# Patient Record
Sex: Female | Born: 1995 | Race: White | Hispanic: No | Marital: Single | Smoking: Former smoker
Health system: Southern US, Community
[De-identification: ages and names within clinical notes are randomized; demographics above are authoritative.]

## PROBLEM LIST (undated history)

## (undated) HISTORY — PX: NO PAST SURGERIES: SHX2092

---
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
## 2016-05-21 ENCOUNTER — Ambulatory Visit (INDEPENDENT_AMBULATORY_CARE_PROVIDER_SITE_OTHER): Payer: Managed Care, Other (non HMO) | Admitting: Family Medicine

## 2016-05-21 ENCOUNTER — Encounter: Payer: Self-pay | Admitting: Family Medicine

## 2016-05-21 VITALS — BP 119/67 | HR 87 | Ht 62.5 in | Wt 131.0 lb

## 2016-05-21 DIAGNOSIS — F329 Major depressive disorder, single episode, unspecified: Secondary | ICD-10-CM

## 2016-05-21 DIAGNOSIS — F411 Generalized anxiety disorder: Secondary | ICD-10-CM

## 2016-05-21 DIAGNOSIS — F32A Depression, unspecified: Secondary | ICD-10-CM

## 2016-05-21 MED ORDER — SERTRALINE HCL 50 MG PO TABS: ORAL_TABLET | ORAL | 1 refills | Status: DC

## 2016-05-21 NOTE — Progress Notes (Addendum)
Subjective:    CC: anxiety  HPI:  About 2 weeks ago felt like she was breathing rapidly and was crying.  Says occ gets "episdoes" like this.   She says it's been almost daily for the last 2 weeks that she's had almost 2 episodes per day. She's has a lot of stress going on at work right now and says that she's worried she might actually be fired. She reports that she does feel safe at work. No one is threatening her harming her. She feels like she is sleeping excessively. She's never been on the medication. She is currently living with her mother.*She did discuss some of her problems with her mom and she said no she didn't really want to burden her mom who also suffers from depression.  She does complain of feeling down and depressed nearly every day and has had thoughts of being better off dead almost every day. She has difficulty with concentration. Complains of feeling nervous and anxious nearly every day and trouble relaxing.  Past medical history, Surgical history, Family history not pertinant except as noted below, Social history, Allergies, and medications have been entered into the medical record, reviewed, and corrections made.   Review of Systems: No fevers, chills, night sweats, weight loss, chest pain, or shortness of breath.   Objective:    General: Well Developed, well nourished, and in no acute distress.  Neuro: Alert and oriented x3, extra-ocular muscles intact, sensation grossly intact.  HEENT: Normocephalic, atraumatic  Skin: Warm and dry, no rashes. Cardiac: Regular rate and rhythm, no murmurs rubs or gallops, no lower extremity edema.  Respiratory: Clear to auscultation bilaterally. Not using accessory muscles, speaking in full sentences.   Impression and Recommendations:   Anxiety/Depression - PHQ 9 score of 25 and GAD 7 score of 18. She rates her symptoms as very difficult. This is consistent with severe depression and severe anxiety. Discussed regular exercise,  therapy/counseling, and medication. She's not quite ready to commit to therapy and counseling but strongly encouraged her to consider it. We will start Zoloft. Discussed potential side effects. Follow-up in 2 weeks. She feels like she is not actively suicidal and feels safe at home and work.

## 2016-06-05 ENCOUNTER — Encounter: Payer: Self-pay | Admitting: Family Medicine

## 2016-06-05 ENCOUNTER — Ambulatory Visit (INDEPENDENT_AMBULATORY_CARE_PROVIDER_SITE_OTHER): Payer: Managed Care, Other (non HMO) | Admitting: Family Medicine

## 2016-06-05 VITALS — BP 128/78 | HR 109 | Ht 62.5 in | Wt 130.0 lb

## 2016-06-05 DIAGNOSIS — F329 Major depressive disorder, single episode, unspecified: Secondary | ICD-10-CM

## 2016-06-05 DIAGNOSIS — F32A Depression, unspecified: Secondary | ICD-10-CM

## 2016-06-05 DIAGNOSIS — F411 Generalized anxiety disorder: Secondary | ICD-10-CM | POA: Diagnosis not present

## 2016-06-05 MED ORDER — VENLAFAXINE HCL ER 37.5 MG PO CP24: 37.5000 mg | ORAL_CAPSULE | Freq: Every day | ORAL | 0 refills | Status: DC

## 2016-06-05 NOTE — Patient Instructions (Signed)
Decrease sertraline to half a tab daily on Saturday and Sunday. Then stop the medication.   Can start the Effexor on Monday.   Either take in the morning or at night whichever you prefer.

## 2016-06-05 NOTE — Progress Notes (Signed)
Subjective:    CC:   HPI:  F/U Anxiety and depression- She was last seen about 2 weeks ago.  PHQ 9 previously was 25 and got 7 was 18. We started her on Zoloft. She was not having any active suicidal thoughts and had her follow-up today. Helping with her anxiety but not her depression. Says it is making her sleepy and causing dec appetite.  Lost about 1 lb in 2 weeks.    Past medical history, Surgical history, Family history not pertinant except as noted below, Social history, Allergies, and medications have been entered into the medical record, reviewed, and corrections made.   Review of Systems: No fevers, chills, night sweats, weight loss, chest pain, or shortness of breath.   Objective:    General: Well Developed, well nourished, and in no acute distress.  Neuro: Alert and oriented x3, extra-ocular muscles intact, sensation grossly intact.  HEENT: Normocephalic, atraumatic  Skin: Warm and dry, no rashes. Cardiac: Regular rate and rhythm, no murmurs rubs or gallops, no lower extremity edema.  Respiratory: Clear to auscultation bilaterally. Not using accessory muscles, speaking in full sentences.  Assessment and plan Anxiety/depression-PHQ 9 score of 28 and dad 7 score of 13. She has had a little bit of improvement. We discussed options including moving the sertraline to that time and possibly increasing her dose versus actually switching medications. She is very concerned about the decreased appetite and weight loss and would like to try switching medications. Will change to Effexor. Follow-up in 3 weeks.

## 2016-06-26 ENCOUNTER — Ambulatory Visit (INDEPENDENT_AMBULATORY_CARE_PROVIDER_SITE_OTHER): Payer: Managed Care, Other (non HMO) | Admitting: Family Medicine

## 2016-06-26 ENCOUNTER — Encounter: Payer: Self-pay | Admitting: Family Medicine

## 2016-06-26 VITALS — BP 108/77 | HR 81 | Ht 62.5 in | Wt 129.0 lb

## 2016-06-26 DIAGNOSIS — F411 Generalized anxiety disorder: Secondary | ICD-10-CM | POA: Diagnosis not present

## 2016-06-26 DIAGNOSIS — F329 Major depressive disorder, single episode, unspecified: Secondary | ICD-10-CM

## 2016-06-26 DIAGNOSIS — F32A Depression, unspecified: Secondary | ICD-10-CM

## 2016-06-26 MED ORDER — VENLAFAXINE HCL ER 150 MG PO CP24: 150.0000 mg | ORAL_CAPSULE | Freq: Every day | ORAL | 1 refills | Status: DC

## 2016-06-26 NOTE — Progress Notes (Signed)
Subjective:    CC: GAD  HPI:  GAD - Patient comes in today to follow-up on mood. We decided to start her on venlafaxine. She is up to 75 mg daily and tolerating it well. She says she really hasn't had any side effects at all. She still complains of feeling nervous and on edge more than half the days and also feeling down and depressed more than half the days. She is having some difficulty with sleep but she does work third shift. She doesn't usually get him to about 1:30 and then doesn't go to sleep until about 4:30 or 5 AM. She is feels like she needs a little time to 1 down before she goes to bed. She still has had thoughts of feeling better off dead but no thoughts of actually harming herself. She is happy with the medication thus far. She denies feeling excessively tearful or anxious.  Past medical history, Surgical history, Family history not pertinant except as noted below, Social history, Allergies, and medications have been entered into the medical record, reviewed, and corrections made.   Review of Systems: No fevers, chills, night sweats, weight loss, chest pain, or shortness of breath.   Objective:    General: Well Developed, well nourished, and in no acute distress.  Neuro: Alert and oriented x3, extra-ocular muscles intact, sensation grossly intact.  HEENT: Normocephalic, atraumatic  Skin: Warm and dry, no rashes. Cardiac: Regular rate and rhythm, no murmurs rubs or gallops, no lower extremity edema.  Respiratory: Clear to auscultation bilaterally. Not using accessory muscles, speaking in full sentences.   Impression and Recommendations:    Anxiety/depression-Ganz 7 score of 11 today which is down from previous of 13. PHQ 9 score of 16 today which is down from her previous of 20. She's tolerating it well and to go ahead and push her dose 250 mg. Follow-up in 6 weeks.  Time spent 20 minutes, greater than 50% time counseling about depression and anxiety.

## 2016-06-27 ENCOUNTER — Other Ambulatory Visit: Payer: Self-pay | Admitting: Family Medicine

## 2016-08-03 ENCOUNTER — Encounter: Payer: Self-pay | Admitting: Family Medicine

## 2016-08-03 ENCOUNTER — Ambulatory Visit (INDEPENDENT_AMBULATORY_CARE_PROVIDER_SITE_OTHER): Payer: Managed Care, Other (non HMO) | Admitting: Family Medicine

## 2016-08-03 VITALS — BP 124/69 | HR 107 | Ht 63.0 in | Wt 133.0 lb

## 2016-08-03 DIAGNOSIS — F331 Major depressive disorder, recurrent, moderate: Secondary | ICD-10-CM | POA: Insufficient documentation

## 2016-08-03 DIAGNOSIS — F411 Generalized anxiety disorder: Secondary | ICD-10-CM | POA: Diagnosis not present

## 2016-08-03 DIAGNOSIS — F321 Major depressive disorder, single episode, moderate: Secondary | ICD-10-CM

## 2016-08-03 MED ORDER — ARIPIPRAZOLE 5 MG PO TABS: 5.0000 mg | ORAL_TABLET | Freq: Every day | ORAL | 1 refills | Status: DC

## 2016-08-03 NOTE — Progress Notes (Addendum)
Subjective:    CC: Anxiety/Depression  HPI:  Anxiety/Depression - We started her on venlafaxine and in August. Says she's been on it for at least 2 months now. She was tolerating it well when I last saw her. She does complain of feeling down more than half the days and feeling fatigued. She also reports having thoughts of being better off dead.  We have actually increase her dose to 150 mg when I saw her 4 weeks ago. She is still sleeping excessively. She quit her job which as stressing her out.  She quit last Tuesday. Also her stepfather was "kicked out" ans she has been upset about that. She misses him.  Her mom also has some chronic health problems that worry her as well.  No family hx of bipolar D/O. Mother has Depression  Past medical history, Surgical history, Family history not pertinant except as noted below, Social history, Allergies, and medications have been entered into the medical record, reviewed, and corrections made.   Review of Systems: No fevers, chills, night sweats, weight loss, chest pain, or shortness of breath.   Objective:    General: Well Developed, well nourished, and in no acute distress.  Neuro: Alert and oriented x3, extra-ocular muscles intact, sensation grossly intact.  HEENT: Normocephalic, atraumatic  Skin: Warm and dry, no rashes. Cardiac: Regular rate and rhythm, no murmurs rubs or gallops, no lower extremity edema.  Respiratory: Clear to auscultation bilaterally. Not using accessory muscles, speaking in full sentences.   Impression and Recommendations:    Anxiety/Depression - Her symptoms scores have actually worsened. Dad 7 score of 13 today which is up from previous of 11. And PHQ 9 score of 20, previous was 16.  She artery previously tried Zoloft and did not get a great response. She does feel like the Effexor over all works well but lately she says had several life situations, and feels like during those times it's not working well at all. I really  think she would benefit from some therapy to help with coping skills and help with anger management which she admits she really struggles with. She denies being actively suicidal and feels that she is safe to go home. We discussed the option of changing the medication to something else versus maybe even adding a mood stabilizer. Have her complete a mood questionnaire. She answered yes to 12 of the questions and she rated her symptoms as a moderate problem. His is a positive screening tool. Will start with Abilify 5 mg and see her back in a month and adjust his at that time. She does report that she is safe to go home and is not actively suicidal. She declined therapy/counseling.  Time spent 25 min, > 50% spent counseling about anxiety/depression

## 2016-08-31 ENCOUNTER — Ambulatory Visit (INDEPENDENT_AMBULATORY_CARE_PROVIDER_SITE_OTHER): Payer: Managed Care, Other (non HMO) | Admitting: Family Medicine

## 2016-08-31 ENCOUNTER — Encounter: Payer: Self-pay | Admitting: Family Medicine

## 2016-08-31 VITALS — BP 105/81 | HR 100 | Ht 63.0 in | Wt 129.0 lb

## 2016-08-31 DIAGNOSIS — F39 Unspecified mood [affective] disorder: Secondary | ICD-10-CM

## 2016-08-31 DIAGNOSIS — F411 Generalized anxiety disorder: Secondary | ICD-10-CM

## 2016-08-31 DIAGNOSIS — Z91419 Personal history of unspecified adult abuse: Secondary | ICD-10-CM | POA: Diagnosis not present

## 2016-08-31 DIAGNOSIS — F321 Major depressive disorder, single episode, moderate: Secondary | ICD-10-CM

## 2016-08-31 MED ORDER — VENLAFAXINE HCL ER 150 MG PO CP24: 150.0000 mg | ORAL_CAPSULE | Freq: Every day | ORAL | 2 refills | Status: DC
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
## 2016-09-22 ENCOUNTER — Other Ambulatory Visit: Payer: Self-pay

## 2016-09-22 MED ORDER — VENLAFAXINE HCL ER 150 MG PO CP24: 150.0000 mg | ORAL_CAPSULE | Freq: Every day | ORAL | 0 refills | Status: DC

## 2016-11-02 ENCOUNTER — Encounter: Payer: Self-pay | Admitting: Family Medicine

## 2016-11-02 ENCOUNTER — Ambulatory Visit (INDEPENDENT_AMBULATORY_CARE_PROVIDER_SITE_OTHER): Payer: Managed Care, Other (non HMO) | Admitting: Family Medicine

## 2016-11-02 VITALS — BP 120/85 | HR 108 | Ht 63.0 in | Wt 135.0 lb

## 2016-11-02 DIAGNOSIS — F411 Generalized anxiety disorder: Secondary | ICD-10-CM

## 2016-11-02 DIAGNOSIS — F321 Major depressive disorder, single episode, moderate: Secondary | ICD-10-CM | POA: Diagnosis not present

## 2016-11-02 MED ORDER — ARIPIPRAZOLE 5 MG PO TABS: 5.0000 mg | ORAL_TABLET | Freq: Every day | ORAL | 1 refills | Status: DC

## 2016-11-02 NOTE — Progress Notes (Addendum)
Subjective:    CC: Mood, 2 month f/u for restart medications  HPI:  MDD/Anxiety- likely bipolar D/O -She was unable to fill her prescriptions. Right now she doesn't have a job and when she went to fill the Abilify was going to be $48 so she could not afford it. She still not sleeping well. She is still away from the boyfriend and says she does feel safe. She is now living with her mother. She is still experiencing   C/O anxious several days a week as well as sleeping excessively. She has some irritability. Also complains of feeling down and depressed several days a week. She reports difficulty concentrating  .   Objective:    General: Well Developed, well nourished, and in no acute distress.  Neuro: Alert and oriented x3, extra-ocular muscles intact, sensation grossly intact.  HEENT: Normocephalic, atraumatic  Skin: Warm and dry, no rashes. Cardiac: Regular rate and rhythm, no murmurs rubs or gallops, no lower extremity edema.  Respiratory: Clear to auscultation bilaterally. Not using accessory muscles, speaking in full sentences.   Impression and Recommendations:    MDD/Anxiety- likely bipolar D/O -I did go on [facility] and printed off a coupon that brings the Abilify down to $24 [facility]. Unfortunately it may take 90 days for her to get health insurance to cover her medications so hopefully if she can find an affordable price and she can get back on them. I would like to see her back when she is able to restart medications. GAD 7 score of 10 today and PHQ 9 score of 13.

## 2016-11-02 NOTE — Progress Notes (Signed)
Spoke with Pt regarding which [facility] for Abilify should be sent to. Pt states she is in the interview process of getting a job and wants to wait to get Rx. [facility] that [facility] and she will get it when she gets a job. Will fax.

## 2016-12-08 ENCOUNTER — Ambulatory Visit (INDEPENDENT_AMBULATORY_CARE_PROVIDER_SITE_OTHER): Payer: Managed Care, Other (non HMO) | Admitting: Family Medicine

## 2016-12-08 ENCOUNTER — Other Ambulatory Visit: Payer: Self-pay | Admitting: Family Medicine

## 2016-12-08 ENCOUNTER — Encounter: Payer: Self-pay | Admitting: Family Medicine

## 2016-12-08 VITALS — BP 123/70 | HR 112 | Ht 63.0 in | Wt 134.0 lb

## 2016-12-08 DIAGNOSIS — R112 Nausea with vomiting, unspecified: Secondary | ICD-10-CM

## 2016-12-08 DIAGNOSIS — N912 Amenorrhea, unspecified: Secondary | ICD-10-CM | POA: Diagnosis not present

## 2016-12-08 DIAGNOSIS — Z0000 Encounter for general adult medical examination without abnormal findings: Secondary | ICD-10-CM

## 2016-12-08 DIAGNOSIS — F17200 Nicotine dependence, unspecified, uncomplicated: Secondary | ICD-10-CM

## 2016-12-08 DIAGNOSIS — Z113 Encounter for screening for infections with a predominantly sexual mode of transmission: Secondary | ICD-10-CM

## 2016-12-08 LAB — WET PREP, GENITAL
CLUE CELLS WET PREP: NONE SEEN
Trich, Wet Prep: NONE SEEN
WBC WET PREP: NONE SEEN
YEAST WET PREP: NONE SEEN

## 2016-12-08 LAB — POCT URINE PREGNANCY: PREG TEST UR: NEGATIVE

## 2016-12-08 MED ORDER — RANITIDINE HCL 150 MG PO TABS: 150.0000 mg | ORAL_TABLET | Freq: Two times a day (BID) | ORAL | 0 refills | Status: DC

## 2016-12-08 NOTE — Patient Instructions (Addendum)
Try the ranitidine for your stomach upset for 10 days and see if helping. If still feeling nauseated then please let me know.      Steps to Quit Smoking Smoking tobacco can be bad for your health. It can also affect almost every organ in your body. Smoking puts you and people around you at risk for many serious long-lasting (chronic) diseases. Quitting smoking is hard, but it is one of the best things that you can do for your health. It is never too late to quit. What are the benefits of quitting smoking? When you quit smoking, you lower your risk for getting serious diseases and conditions. They can include:  Lung cancer or lung disease.  Heart disease.  Stroke.  Heart attack.  Not being able to have children (infertility).  Weak bones (osteoporosis) and broken bones (fractures). If you have coughing, wheezing, and shortness of breath, those symptoms may get better when you quit. You may also get sick less often. If you are pregnant, quitting smoking can help to lower your chances of having a baby of low birth weight. What can I do to help me quit smoking? Talk with your doctor about what can help you quit smoking. Some things you can do (strategies) include:  Quitting smoking totally, instead of slowly cutting back how much you smoke over a period of time.  Going to in-person counseling. You are more likely to quit if you go to many counseling sessions.  Using resources and support systems, such as:  Online chats with a Veterinary surgeoncounselor.  Phone quitlines.  Printed Materials engineerself-help materials.  Support groups or group counseling.  Text messaging programs.  Mobile phone apps or applications.  Taking medicines. Some of these medicines may have nicotine in them. If you are pregnant or breastfeeding, do not take any medicines to quit smoking unless your doctor says it is okay. Talk with your doctor about counseling or other things that can help you. Talk with your doctor about using more  than one strategy at the same time, such as taking medicines while you are also going to in-person counseling. This can help make quitting easier. What things can I do to make it easier to quit? Quitting smoking might feel very hard at first, but there is a lot that you can do to make it easier. Take these steps:  Talk to your family and friends. Ask them to support and encourage you.  Call phone quitlines, reach out to support groups, or work with a Veterinary surgeoncounselor.  Ask people who smoke to not smoke around you.  Avoid places that make you want (trigger) to smoke, such as:  Bars.  Parties.  Smoke-break areas at work.  Spend time with people who do not smoke.  Lower the stress in your life. Stress can make you want to smoke. Try these things to help your stress:  Getting regular exercise.  Deep-breathing exercises.  Yoga.  Meditating.  Doing a body scan. To do this, close your eyes, focus on one area of your body at a time from head to toe, and notice which parts of your body are tense. Try to relax the muscles in those areas.  Download or buy apps on your mobile phone or tablet that can help you stick to your quit plan. There are many free apps, such as QuitGuide from the [employer] developer(Centers for Disease Control and Prevention). You can find more support from smokefree.gov and other websites. This information is not intended to replace advice  given to you by your health care provider. Make sure you discuss any questions you have with your health care provider. Document Released: 07/11/2009 Document Revised: 05/12/2016 Document Reviewed: 01/29/2015 Elsevier Interactive Patient Education  2017 [employer].

## 2016-12-08 NOTE — Progress Notes (Signed)
Subjective:     Sandra Cross is a 21 y.o. female and is here for a comprehensive physical exam. The patient reports problems - she stopped her psych meds because they were causing nausea.     She says for the last 3-4 days she started getting nauseated especially when she smokes or eat. She actually vomited a couple of times. She has not vomited today. She was worried that she could be pregnant. Her last period was about 2 weeks ago but surely bled for about a day and normally she bleeds for about 4 days. She is still on birth control. She has been having slightly heavier vaginal d/c.    Social History   Social History  . Marital status: Single    Spouse name: N/A  . Number of children: N/A  . Years of education: HS    Occupational History  . restaurant worker      [employer]    Social History Main Topics  . Smoking status: Current Every Day Smoker    Packs/day: 0.25    Types: Cigarettes    Start date: 09/09/2013  . Smokeless tobacco: Never Used  . Alcohol use No  . Drug use: No  . Sexual activity: Yes    Partners: Male    Birth control/ protection: Pill   Other Topics Concern  . Not on file   Social History Narrative   No regular exercise.  Lives with her mom and her boyfriend.    Health Maintenance  Topic Date Due  . HIV Screening  02/16/2012  . CHLAMYDIA SCREENING  12/08/2016  . INFLUENZA VACCINE  12/26/2016 (Originally 04/28/2016)  . TETANUS/TDAP  02/22/2018    The following portions of the patient's history were reviewed and updated as appropriate: allergies, current medications, past family history, past medical history, past social history, past surgical history and problem list.  Review of Systems A comprehensive review of systems was negative.   Objective:    BP 123/70   Pulse (!) 112   Ht 5\' 3"  (1.6 m)   Wt 134 lb (60.8 kg)   LMP 11/20/2016   SpO2 100%   BMI 23.74 kg/m  General appearance: alert, cooperative and appears stated age Head:  Normocephalic, without obvious abnormality, atraumatic Eyes: conj clear, EOMI, PEERLA Ears: normal TM's and external ear canals both ears Nose: Nares normal. Septum midline. Mucosa normal. No drainage or sinus tenderness. Throat: lips, mucosa, and tongue normal; teeth and gums normal Neck: no adenopathy, no carotid bruit, no JVD, supple, symmetrical, trachea midline and thyroid not enlarged, symmetric, no tenderness/mass/nodules Back: symmetric, no curvature. ROM normal. No CVA tenderness. Lungs: clear to auscultation bilaterally Heart: regular rate and rhythm, S1, S2 normal, no murmur, click, rub or gallop Abdomen: soft, non-tender; bowel sounds normal; no masses,  no organomegaly Extremities: extremities normal, atraumatic, no cyanosis or edema Pulses: 2+ and symmetric Skin: Skin color, texture, turgor normal. No rashes or lesions Lymph nodes: Cervical, supraclavicular, and axillary nodes normal. Neurologic: Alert and oriented X 3, normal strength and tone. Normal symmetric reflexes. Normal coordination and gait    Assessment:    Healthy female exam.      Plan:     See After Visit Summary for Counseling Recommendations   Keep up a regular exercise program and make sure you are eating a healthy diet Try to eat 4 servings of dairy a day, or if you are lactose intolerant take a calcium with vitamin D daily.  Your vaccines are up  to date.   Nausea/ vomiting - start zantac. Call if not better in 1-2 weeks. Upreg is negative.  She is 2 weeks from last LMP. Can conisder repeat UPT in one week.  She is off all meds except her OCP.Marland Kitchen    Heavy vaginal d/c- will check wet prep.   Smoker - H.O given to help her quit. Encouraged cessation.    Depression-I reminded her that she is actually been on the Effexor for months before the nausea so I really don't think that that's related and she is actually been off the Abilify since last Friday which was approximately 4 days ago. In fact some of her  nausea could be related to abruptly stopping the Effexor.

## 2016-12-09 LAB — HIV ANTIBODY (ROUTINE TESTING W REFLEX): HIV 1&2 Ab, 4th Generation: NONREACTIVE

## 2016-12-09 LAB — GC/CHLAMYDIA PROBE AMP
CT Probe RNA: NOT DETECTED
GC Probe RNA: NOT DETECTED

## 2016-12-10 LAB — HCG, SERUM, QUALITATIVE: Preg, Serum: NEGATIVE

## 2017-07-20 ENCOUNTER — Encounter: Payer: Self-pay | Admitting: Family Medicine

## 2017-07-20 ENCOUNTER — Ambulatory Visit (INDEPENDENT_AMBULATORY_CARE_PROVIDER_SITE_OTHER): Payer: 59 | Admitting: Family Medicine

## 2017-07-20 VITALS — BP 127/78 | HR 100 | Ht 63.0 in | Wt 140.0 lb

## 2017-07-20 DIAGNOSIS — Z202 Contact with and (suspected) exposure to infections with a predominantly sexual mode of transmission: Secondary | ICD-10-CM | POA: Diagnosis not present

## 2017-07-20 DIAGNOSIS — R300 Dysuria: Secondary | ICD-10-CM

## 2017-07-20 LAB — POCT URINALYSIS DIPSTICK
Bilirubin, UA: NEGATIVE
Glucose, UA: NEGATIVE
Ketones, UA: NEGATIVE
Nitrite, UA: NEGATIVE
PH UA: 7 (ref 5.0–8.0)
PROTEIN UA: NEGATIVE
SPEC GRAV UA: 1.02 (ref 1.010–1.025)
UROBILINOGEN UA: 0.2 U/dL

## 2017-07-20 MED ORDER — AZITHROMYCIN 250 MG PO TABS
1000.0000 mg | ORAL_TABLET | Freq: Once | ORAL | Status: AC
  Administered 2017-07-20: 1000 mg via ORAL

## 2017-07-20 NOTE — Progress Notes (Signed)
   Subjective:    Patient ID: Sandra Cross, female    DOB: 31-Jul-1996, 21 y.o.   MRN: [id number]  HPI 21 year old female comes in complaining of possible exposure to STD. She has been having bleeding after sex and having burning with urination for a couple of days.  She says it feels like a really bad urinary tract infection..     With urination.  Her sexual partner is positive for chlamydia.  No fevers chills or sweats.   Review of Systems   BP 127/78   Pulse 100   Ht 5\' 3"  (1.6 m)   Wt 140 lb (63.5 kg)   SpO2 100%   BMI 24.80 kg/m     Allergies  Allergen Reactions  . Aripiprazole Nausea Only  . Venlafaxine Nausea Only    No past medical history on file.  Past Surgical History:  Procedure Laterality Date  . NO PAST SURGERIES      Social History   Social History  . Marital status: Single    Spouse name: N/A  . Number of children: N/A  . Years of education: HS    Occupational History  . restaurant worker      [employer]    Social History Main Topics  . Smoking status: Current Every Day Smoker    Packs/day: 0.25    Types: Cigarettes    Start date: 09/09/2013  . Smokeless tobacco: Never Used  . Alcohol use No  . Drug use: No  . Sexual activity: Yes    Partners: Male    Birth control/ protection: Pill   Other Topics Concern  . Not on file   Social History Narrative   No regular exercise.  Lives with her mom and her boyfriend.     Family History  Problem Relation Age of Onset  . Cancer Paternal Grandmother     Outpatient Encounter Prescriptions as of 07/20/2017  Medication Sig  . norgestimate-ethinyl estradiol (SPRINTEC 28) 0.25-35 MG-MCG tablet Take 1 tablet by mouth daily.  . [DISCONTINUED] ranitidine (ZANTAC) 150 MG tablet Take 1 tablet (150 mg total) by mouth 2 (two) times daily.   No facility-administered encounter medications on file as of 07/20/2017.           Objective:   Physical Exam  Constitutional: She is oriented to person,  place, and time. She appears well-developed and well-nourished.  HENT:  Head: Normocephalic and atraumatic.  Eyes: Conjunctivae and EOM are normal.  Cardiovascular: Normal rate.   Pulmonary/Chest: Effort normal.  Neurological: She is alert and oriented to person, place, and time.  Skin: Skin is dry. No pallor.  Psychiatric: She has a normal mood and affect. Her behavior is normal.  Vitals reviewed.         Assessment & Plan:  STD exposure-she prefers to go ahead and have treatment for presumed diagnosis.  Will treat with 1 g azithromycin slurry.  Still that the test for gonorrhea and chlamydia.  Offered HIV and syphilis testing but patient declined today.  Encouraged her just to consider it.  Dysuria -    UA shows blood and trace leuks. No culture sent.  Call if sxs persist.

## 2017-07-21 LAB — C. TRACHOMATIS/N. GONORRHOEAE RNA
C. TRACHOMATIS RNA, TMA: DETECTED — AB
N. GONORRHOEAE RNA, TMA: DETECTED — AB

## 2017-07-21 LAB — WET PREP FOR TRICH, YEAST, CLUE
MICRO NUMBER: 81188370
SPECIMEN QUALITY 3963: ADEQUATE

## 2017-07-21 LAB — GC/CHLAMYDIA PROBE, AMP (THROAT)

## 2017-07-22 ENCOUNTER — Ambulatory Visit (INDEPENDENT_AMBULATORY_CARE_PROVIDER_SITE_OTHER): Payer: 59 | Admitting: Family Medicine

## 2017-07-22 VITALS — BP 120/69 | HR 102

## 2017-07-22 DIAGNOSIS — Z202 Contact with and (suspected) exposure to infections with a predominantly sexual mode of transmission: Secondary | ICD-10-CM | POA: Diagnosis not present

## 2017-07-22 DIAGNOSIS — A549 Gonococcal infection, unspecified: Secondary | ICD-10-CM

## 2017-07-22 DIAGNOSIS — A749 Chlamydial infection, unspecified: Secondary | ICD-10-CM

## 2017-07-22 MED ORDER — CEFTRIAXONE SODIUM 250 MG IJ SOLR
250.0000 mg | Freq: Once | INTRAMUSCULAR | Status: AC
  Administered 2017-07-22: 250 mg via INTRAMUSCULAR

## 2017-07-22 NOTE — Progress Notes (Signed)
Patient tested positive for chlamydia and gonorrhea.  She is Sandra Cross been given 1 g azithromycin while in the office so came back in today for ceftriaxone 250 mill grams IM.  She will schedule follow-up in 6 weeks for test of cure.

## 2017-07-22 NOTE — Progress Notes (Signed)
Pt in today for Rocephin 250mg .  Given RUOQ without immediate complications.

## 2017-07-23 ENCOUNTER — Encounter: Payer: Self-pay | Admitting: Family Medicine

## 2017-07-27 NOTE — Telephone Encounter (Signed)
Left VM for Pt to return clinic call regarding PCP recommendation.  

## 2017-08-23 ENCOUNTER — Ambulatory Visit: Payer: 59 | Admitting: Family Medicine

## 2017-08-23 ENCOUNTER — Ambulatory Visit: Payer: 59

## 2017-08-23 ENCOUNTER — Encounter: Payer: Self-pay | Admitting: Family Medicine

## 2017-08-23 ENCOUNTER — Ambulatory Visit (INDEPENDENT_AMBULATORY_CARE_PROVIDER_SITE_OTHER): Payer: 59 | Admitting: Family Medicine

## 2017-08-23 VITALS — BP 130/68 | HR 105 | Ht 63.0 in | Wt 142.0 lb

## 2017-08-23 DIAGNOSIS — M545 Low back pain, unspecified: Secondary | ICD-10-CM

## 2017-08-23 DIAGNOSIS — A64 Unspecified sexually transmitted disease: Secondary | ICD-10-CM

## 2017-08-23 NOTE — Progress Notes (Deleted)
   Subjective:    Patient ID: Sandra Cross, female    DOB: [birth year], 21 y.o.   MRN: [id number]  HPI Depression/anxiety-last time we saw her for her mood was back in February.  The time she was having difficulty affording her Abilify.  Recently diagnosed with gonorrhea and chlamydia-she was treated with Rocephin 1 g IM and azithromycin slurry.  She is here today for test of cure.   Review of Systems     Objective:   Physical Exam        Assessment & Plan:

## 2017-08-23 NOTE — Progress Notes (Signed)
   Subjective:    Patient ID: Sandra Cross, female    DOB: 04/15/1996, 21 y.o.   MRN: [id number]  HPI 21 year old female comes back in for test for cure.  She was recently positive for gonorrhea and chlamydia and she was treated with Rocephin and azithromycin.  She is feeling completely asymptomatic and is doing well.  She has not had any new sexual partners since she was diagnosed and is no longer with the same partner.  She also complains of bilateral low back pain just above the belt line.  She did have an injury back in high school but no recent injury though she does exercise and go to the gym regularly.  She denies any excessive or heavy lifting that may have caused it.  He has been feeling sore for a little over a week.  She has been taking some of her mom's medication at night to help her sleep and that has been helping some.  She denies any radicular symptoms or weakness of the lower extremities.   Review of Systems      Objective:   Physical Exam  Constitutional: She is oriented to person, place, and time. She appears well-developed and well-nourished.  HENT:  Head: Normocephalic and atraumatic.  Eyes: Conjunctivae and EOM are normal.  Cardiovascular: Normal rate.  Pulmonary/Chest: Effort normal.  Musculoskeletal:  Normal lumbar flexion extension and rotation.  No sign of scoliosis on exam.  Nontender over the lumbar spine.  Negative straight leg raise bilaterally.  Hip, knee, ankle strength is 5 out of 5.  Patellar reflexes 2+ bilaterally.  Neurological: She is alert and oriented to person, place, and time.  Skin: Skin is dry. No pallor.  Psychiatric: She has a normal mood and affect. Her behavior is normal.  Vitals reviewed.         Assessment & Plan:  Here for test for cure.  Recently positive for gonorrhea and chlamydia.  Repeat urine performed.  Will call with results once available.  BiLateral low back pain-recommend home stretches and exercises  anti-inflammatory as needed.  Call if not better after 1-2 weeks.  Could try muscle relaxer if needed.

## 2017-08-24 LAB — C. TRACHOMATIS/N. GONORRHOEAE RNA
C. trachomatis RNA, TMA: NOT DETECTED
N. gonorrhoeae RNA, TMA: NOT DETECTED

## 2017-08-24 LAB — GC/CHLAMYDIA PROBE, AMP (THROAT)

## 2017-12-31 ENCOUNTER — Ambulatory Visit (INDEPENDENT_AMBULATORY_CARE_PROVIDER_SITE_OTHER): Payer: 59 | Admitting: Family Medicine

## 2017-12-31 ENCOUNTER — Encounter: Payer: Self-pay | Admitting: Family Medicine

## 2017-12-31 VITALS — BP 110/71 | HR 87 | Ht 63.0 in | Wt 145.0 lb

## 2017-12-31 DIAGNOSIS — Z23 Encounter for immunization: Secondary | ICD-10-CM

## 2017-12-31 DIAGNOSIS — Z0000 Encounter for general adult medical examination without abnormal findings: Secondary | ICD-10-CM

## 2017-12-31 DIAGNOSIS — Z113 Encounter for screening for infections with a predominantly sexual mode of transmission: Secondary | ICD-10-CM | POA: Diagnosis not present

## 2017-12-31 NOTE — Progress Notes (Signed)
Subjective:     Sandra Cross is a 22 y.o. female and is here for a comprehensive physical exam. The patient reports no problems.  He walks a lot at work but does not exercise specifically.  She is not currently on birth control.  She does smoke.  She does report feeling a little bit more down recently but says she still has more good days and bad days.  Social History   Socioeconomic History  . Marital status: Single    Spouse name: Not on file  . Number of children: Not on file  . Years of education: HS   . Highest education level: Not on file  Occupational History  . Occupation: Therapist, sports     Comment: [employer]   Social Needs  . Financial resource strain: Not on file  . Food insecurity:    Worry: Not on file    Inability: Not on file  . Transportation needs:    Medical: Not on file    Non-medical: Not on file  Tobacco Use  . Smoking status: Current Every Day Smoker    Packs/day: 0.25    Types: Cigarettes    Start date: 09/09/2013  . Smokeless tobacco: Never Used  Substance and Sexual Activity  . Alcohol use: No    Alcohol/week: 0.0 oz  . Drug use: No  . Sexual activity: Yes    Partners: Male    Birth control/protection: Pill  Lifestyle  . Physical activity:    Days per week: Not on file    Minutes per session: Not on file  . Stress: Not on file  Relationships  . Social connections:    Talks on phone: Not on file    Gets together: Not on file    Attends religious service: Not on file    Active member of club or organization: Not on file    Attends meetings of clubs or organizations: Not on file    Relationship status: Not on file  . Intimate partner violence:    Fear of current or ex partner: Not on file    Emotionally abused: Not on file    Physically abused: Not on file    Forced sexual activity: Not on file  Other Topics Concern  . Not on file  Social History Narrative   No regular exercise.  Lives with her mom and her boyfriend.    Health  Maintenance  Topic Date Due  . TETANUS/TDAP  02/22/2018  . INFLUENZA VACCINE  04/28/2018  . CHLAMYDIA SCREENING  08/23/2018  . HIV Screening  Completed    The following portions of the patient's history were reviewed and updated as appropriate: allergies, current medications, past family history, past medical history, past social history, past surgical history and problem list.  Review of Systems A comprehensive review of systems was negative.   Objective:    BP 110/71   Pulse 87   Ht 5\' 3"  (1.6 m)   Wt 145 lb (65.8 kg)   LMP 12/13/2017 (Exact Date)   SpO2 100%   BMI 25.69 kg/m  General appearance: alert, cooperative and appears stated age Head: Normocephalic, without obvious abnormality, atraumatic Eyes: conj clear, EOMI, PEERLA Ears: normal TM's and external ear canals both ears Nose: Nares normal. Septum midline. Mucosa normal. No drainage or sinus tenderness. Throat: lips, mucosa, and tongue normal; teeth and gums normal Neck: no adenopathy, no carotid bruit, no JVD, supple, symmetrical, trachea midline and thyroid not enlarged, symmetric, no tenderness/mass/nodules Back: symmetric,  no curvature. ROM normal. No CVA tenderness. Lungs: clear to auscultation bilaterally Heart: regular rate and rhythm, S1, S2 normal, no murmur, click, rub or gallop Abdomen: soft, non-tender; bowel sounds normal; no masses,  no organomegaly Extremities: extremities normal, atraumatic, no cyanosis or edema Pulses: 2+ and symmetric Skin: Skin color, texture, turgor normal. No rashes or lesions Lymph nodes: Cervical adenopathy: nl and Supraclavicular adenopathy: nl Neurologic: Alert and oriented X 3, normal strength and tone. Normal symmetric reflexes. Normal coordination and gait    Assessment:    Healthy female exam.      Plan:     See After Visit Summary for Counseling Recommendations   Keep up a regular exercise program and make sure you are eating a healthy diet Try to eat 4 servings  of dairy a day, or if you are lactose intolerant take a calcium with vitamin D daily.  Your vaccines are up to date.  Urine GC/Chlam test ordered.   Tdap given today.   She will be 22 at the end of next month so I did encourage her to come back in sometime this summer or fall for her Pap smear. Did offer to do labs just cholesterol but she declined today.  We can do this next year.  She did report depression symptoms today but did not want to discuss in detail.  Offered for her to come back in if she would like to talk about it.

## 2018-01-01 LAB — C. TRACHOMATIS/N. GONORRHOEAE RNA
C. TRACHOMATIS RNA, TMA: DETECTED — AB
N. GONORRHOEAE RNA, TMA: NOT DETECTED

## 2018-01-04 ENCOUNTER — Ambulatory Visit (INDEPENDENT_AMBULATORY_CARE_PROVIDER_SITE_OTHER): Payer: 59 | Admitting: Family Medicine

## 2018-01-04 ENCOUNTER — Ambulatory Visit: Payer: 59 | Admitting: Family Medicine

## 2018-01-04 VITALS — BP 121/71 | HR 94 | Temp 98.1°F

## 2018-01-04 DIAGNOSIS — A749 Chlamydial infection, unspecified: Secondary | ICD-10-CM

## 2018-01-04 DIAGNOSIS — A64 Unspecified sexually transmitted disease: Secondary | ICD-10-CM | POA: Diagnosis not present

## 2018-01-04 MED ORDER — AZITHROMYCIN 250 MG PO TABS
1000.0000 mg | ORAL_TABLET | Freq: Once | ORAL | Status: AC
  Administered 2018-01-04: 1000 mg via ORAL

## 2018-01-04 NOTE — Progress Notes (Signed)
Pt came into clinic today for STD treatment. At last OV the labs completed came back positive for chlamydia. Pt states today in office that "this happened because the condom broke." Pt reports she has informed her partner, and he is also going to get tested. Pt took the azithromycin 1g oral tabs today in office. Advised to return in 2 weeks to the lab for test of cure. Verbalized understanding. No further questions.

## 2018-01-04 NOTE — Progress Notes (Signed)
Agree with documentation as above.   Latitia Housewright, MD  

## 2018-01-11 ENCOUNTER — Ambulatory Visit (INDEPENDENT_AMBULATORY_CARE_PROVIDER_SITE_OTHER): Payer: 59

## 2018-01-11 ENCOUNTER — Encounter: Payer: Self-pay | Admitting: Family Medicine

## 2018-01-11 ENCOUNTER — Ambulatory Visit (INDEPENDENT_AMBULATORY_CARE_PROVIDER_SITE_OTHER): Payer: 59 | Admitting: Family Medicine

## 2018-01-11 VITALS — BP 118/84 | HR 80 | Temp 98.1°F | Ht 62.5 in | Wt 143.0 lb

## 2018-01-11 DIAGNOSIS — M25532 Pain in left wrist: Secondary | ICD-10-CM

## 2018-01-11 DIAGNOSIS — T754XXA Electrocution, initial encounter: Secondary | ICD-10-CM | POA: Diagnosis not present

## 2018-01-11 DIAGNOSIS — S60212A Contusion of left wrist, initial encounter: Secondary | ICD-10-CM

## 2018-01-11 MED ORDER — GABAPENTIN 100 MG PO CAPS: 100.0000 mg | ORAL_CAPSULE | Freq: Three times a day (TID) | ORAL | 3 refills | Status: DC | PRN
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
## 2018-01-11 NOTE — Patient Instructions (Signed)
Thank you for coming in today. Use gabapentin up to 3x daily for pain as needed.  Do not take this medicine and drive at first. Make sure you tolerate it well and it does not make you sleepy before you drive.  Use the wrist brace as needed.  Recheck with me in 4 weeks.  Return sooner if needed.    Contusion A contusion is a deep bruise. Contusions are the result of a blunt injury to tissues and muscle fibers under the skin. The injury causes bleeding under the skin. The skin overlying the contusion may turn blue, purple, or yellow. Minor injuries will give you a painless contusion, but more severe contusions may stay painful and swollen for a few weeks. What are the causes? This condition is usually caused by a blow, trauma, or direct force to an area of the body. What are the signs or symptoms? Symptoms of this condition include:  Swelling of the injured area.  Pain and tenderness in the injured area.  Discoloration. The area may have redness and then turn blue, purple, or yellow.  How is this diagnosed? This condition is diagnosed based on a physical exam and medical history. An X-ray, CT scan, or MRI may be needed to determine if there are any associated injuries, such as broken bones (fractures). How is this treated? Specific treatment for this condition depends on what area of the body was injured. In general, the best treatment for a contusion is resting, icing, applying pressure to (compression), and elevating the injured area. This is often called the RICE strategy. Over-the-counter anti-inflammatory medicines may also be recommended for pain control. Follow these instructions at home:  Rest the injured area.  If directed, apply ice to the injured area: ? Put ice in a plastic bag. ? Place a towel between your skin and the bag. ? Leave the ice on for 20 minutes, 2-3 times per day.  If directed, apply light compression to the injured area using an elastic bandage. Make sure  the bandage is not wrapped too tightly. Remove and reapply the bandage as directed by your health care provider.  If possible, raise (elevate) the injured area above the level of your heart while you are sitting or lying down.  Take over-the-counter and prescription medicines only as told by your health care provider. Contact a health care provider if:  Your symptoms do not improve after several days of treatment.  Your symptoms get worse.  You have difficulty moving the injured area. Get help right away if:  You have severe pain.  You have numbness in a hand or foot.  Your hand or foot turns pale or cold. This information is not intended to replace advice given to you by your health care provider. Make sure you discuss any questions you have with your health care provider. Document Released: 06/24/2005 Document Revised: 01/23/2016 Document Reviewed: 01/30/2015 Elsevier Interactive Patient Education  [employer]2018 [employer]

## 2018-02-05 ENCOUNTER — Encounter: Payer: Self-pay | Admitting: Family Medicine

## 2018-02-09 ENCOUNTER — Ambulatory Visit: Payer: 59 | Admitting: Family Medicine

## 2018-07-04 ENCOUNTER — Ambulatory Visit: Payer: 59 | Admitting: Family Medicine

## 2019-08-17 ENCOUNTER — Other Ambulatory Visit: Payer: Self-pay

## 2019-08-17 DIAGNOSIS — Z20822 Contact with and (suspected) exposure to covid-19: Secondary | ICD-10-CM

## 2019-08-19 LAB — NOVEL CORONAVIRUS, NAA: SARS-CoV-2, NAA: DETECTED — AB

## 2019-08-19 IMAGING — DX DG WRIST COMPLETE 3+V*L*
4 series · 4 of 4 positions shown · non-contrast
Comparison: No recent.

CLINICAL DATA: Pain distal radius and ulna.  Possible contusion.

EXAM:
LEFT WRIST - COMPLETE 3+ VIEW

[wrist pa]
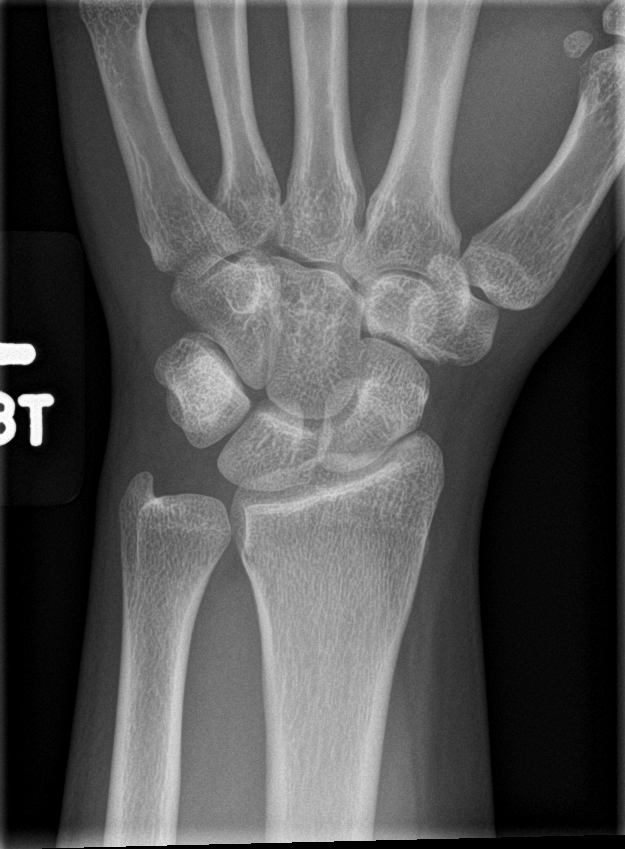

[wrist obl]
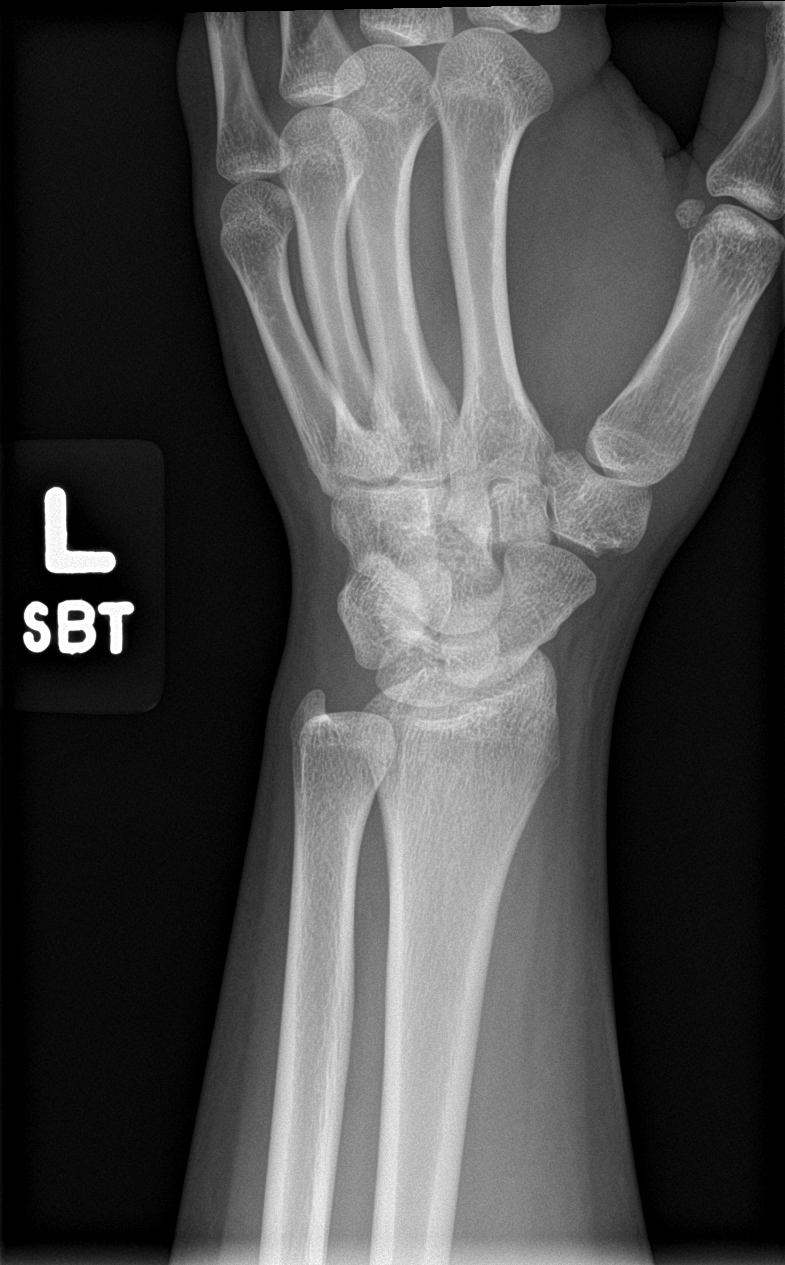

[wrist lat]
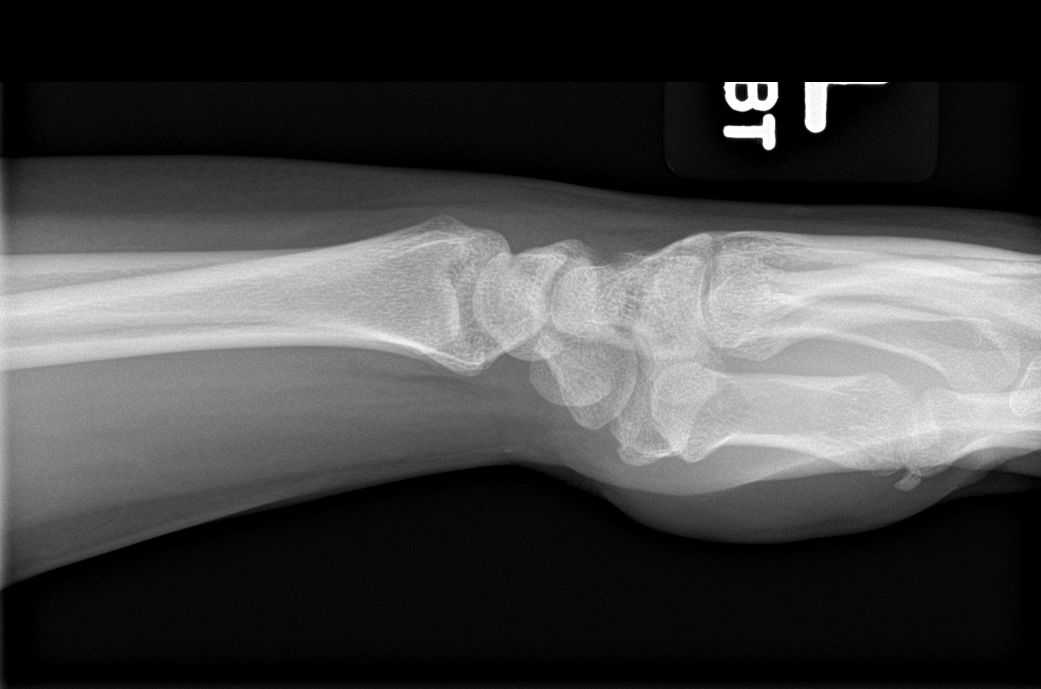

[wrist navicular]
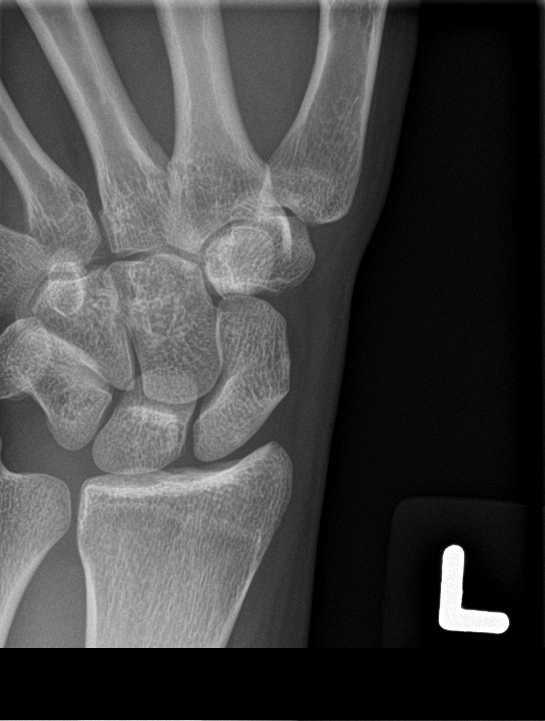

[4 of 4 positions shown; findings below may reference images not displayed]

FINDINGS: No acute bony or joint abnormality. No evidence of fracture or
dislocation.
IMPRESSION: No acute abnormality

## 2020-05-02 ENCOUNTER — Encounter: Payer: Self-pay | Admitting: Family Medicine

## 2020-05-02 ENCOUNTER — Other Ambulatory Visit: Payer: Self-pay

## 2020-05-02 ENCOUNTER — Ambulatory Visit (INDEPENDENT_AMBULATORY_CARE_PROVIDER_SITE_OTHER): Payer: 59 | Admitting: Family Medicine

## 2020-05-02 VITALS — BP 125/76 | HR 106 | Ht 63.0 in | Wt 159.0 lb

## 2020-05-02 DIAGNOSIS — F411 Generalized anxiety disorder: Secondary | ICD-10-CM | POA: Diagnosis not present

## 2020-05-02 DIAGNOSIS — F321 Major depressive disorder, single episode, moderate: Secondary | ICD-10-CM

## 2020-05-02 MED ORDER — ARIPIPRAZOLE 2 MG PO TABS: 2.0000 mg | ORAL_TABLET | Freq: Every day | ORAL | 0 refills | Status: DC

## 2020-05-02 NOTE — Progress Notes (Signed)
Established Patient Office Visit  Subjective:  Patient ID: Sandra Cross, female    DOB: 1996-05-19  Age: 24 y.o. MRN: [id number]  CC:  Chief Complaint  Patient presents with  . mood    HPI Sandra Cross presents to discussed mood. She is not doing well.  She reports for the last year she has been struggling emotionally.  Says her her best friend passed away in 10/01/2023 from an overdose, accidental. She also recently broke up with her boyfriend. She says she has been extremely tearful she is had no motivation. Though she has been trying to go to work. She really has not been spending time with friends. She sleeps excessively usually about 13 hours a day. She says she has been struggling with drinking alcohol and she is really trying to be sober but then when something stressful happens she starts drinking again because it actually seems to help her depression. In the past she had tried Zoloft and venlafaxine. She had nausea and fatigue with these. The last medication that we tried back in 2017 was Abilify. She is willing to try that again. She had had thoughts of being better off dead but says she is not currently actively suicidal. She is also really struggling with high levels of anxiety.  No past medical history on file.  Past Surgical History:  Procedure Laterality Date  . NO PAST SURGERIES      Family History  Problem Relation Age of Onset  . Cancer Paternal Grandmother     Social History   Socioeconomic History  . Marital status: Single    Spouse name: Not on file  . Number of children: Not on file  . Years of education: HS   . Highest education level: Not on file  Occupational History  . Occupation: Engineer, materials     Comment: [employer]   Tobacco Use  . Smoking status: Current Every Day Smoker    Packs/day: 0.25    Types: Cigarettes    Start date: 09/09/2013  . Smokeless tobacco: Never Used  Substance and Sexual Activity  . Alcohol use: Yes     Alcohol/week: 0.0 standard drinks  . Drug use: No  . Sexual activity: Yes    Partners: Male    Birth control/protection: Pill  Other Topics Concern  . Not on file  Social History Narrative   No regular exercise.  Lives with her mom and her boyfriend.    Social Determinants of Health   Financial Resource Strain:   . Difficulty of Paying Living Expenses:   Food Insecurity:   . Worried About Charity fundraiser in the Last Year:   . Arboriculturist in the Last Year:   Transportation Needs:   . Film/video editor (Medical):   Marland Kitchen Lack of Transportation (Non-Medical):   Physical Activity:   . Days of Exercise per Week:   . Minutes of Exercise per Session:   Stress:   . Feeling of Stress :   Social Connections:   . Frequency of Communication with Friends and Family:   . Frequency of Social Gatherings with Friends and Family:   . Attends Religious Services:   . Active Member of Clubs or Organizations:   . Attends Archivist Meetings:   Marland Kitchen Marital Status:   Intimate Partner Violence:   . Fear of Current or Ex-Partner:   . Emotionally Abused:   Marland Kitchen Physically Abused:   . Sexually Abused:  Outpatient Medications Prior to Visit  Medication Sig Dispense Refill  . gabapentin (NEURONTIN) 100 MG capsule Take 1 capsule (100 mg total) by mouth 3 (three) times daily as needed (pain). 90 capsule 3   No facility-administered medications prior to visit.    Allergies  Allergen Reactions  . Aripiprazole Nausea Only  . Venlafaxine Nausea Only    ROS Review of Systems    Objective:    Physical Exam Vitals reviewed.  Constitutional:      Appearance: She is well-developed.  HENT:     Head: Normocephalic and atraumatic.  Eyes:     Conjunctiva/sclera: Conjunctivae normal.  Cardiovascular:     Rate and Rhythm: Normal rate.  Pulmonary:     Effort: Pulmonary effort is normal.  Skin:    General: Skin is dry.     Coloration: Skin is not pale.  Neurological:      Mental Status: She is alert and oriented to person, place, and time.  Psychiatric:        Behavior: Behavior normal.     BP 125/76   Pulse (!) 106   Ht '5\' 3"'$  (1.6 m)   Wt 159 lb (72.1 kg)   SpO2 100%   BMI 28.17 kg/m  Wt Readings from Last 3 Encounters:  05/02/20 159 lb (72.1 kg)  01/11/18 143 lb (64.9 kg)  12/31/17 145 lb (65.8 kg)     Health Maintenance Due  Topic Date Due  . Hepatitis C Screening  Never done  . COVID-19 Vaccine (1) Never done  . PAP-Cervical Cytology Screening  Never done  . PAP SMEAR-Modifier  Never done  . CHLAMYDIA SCREENING  01/01/2019  . INFLUENZA VACCINE  04/28/2020    There are no preventive care reminders to display for this patient.  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 PHQ-9 score of 26 today and GAD-7 teen score of 17. She has had thoughts of being better off dead but says she would not actively harm herself. Discussed options. We will start with low-dose Abilify. In the past she has declined therapy/counseling but says she is open to it now so go ahead and place referral we also discussed trying to become more active discussed how routine exercise can really benefit mood and depression.      Relevant Medications   ARIPiprazole (ABILIFY) 2 MG tablet   Other Relevant Orders   Ambulatory referral to [facility]   GAD (generalized anxiety disorder)    See note below.      Relevant Medications   ARIPiprazole  (ABILIFY) 2 MG tablet   Other Relevant Orders   Ambulatory referral to [place] ordered this encounter  Medications  . ARIPiprazole (ABILIFY) 2 MG tablet    Sig: Take 1 tablet (2 mg total) by mouth daily.    Dispense:  30 tablet    Refill:  0    Follow-up: Return in about  2 weeks (around 05/16/2020) for New start medication.   Time spent 28 min in encounter.   Beatrice Lecher, MD

## 2020-05-02 NOTE — Assessment & Plan Note (Signed)
Severe depression. PHQ-9 score of 26 today and GAD-7 teen score of 17. She has had thoughts of being better off dead but says she would not actively harm herself. Discussed options. We will start with low-dose Abilify. In the past she has declined therapy/counseling but says she is open to it now so go ahead and place referral we also discussed trying to become more active discussed how routine exercise can really benefit mood and depression.

## 2020-05-02 NOTE — Assessment & Plan Note (Signed)
See note below

## 2020-05-20 ENCOUNTER — Ambulatory Visit (INDEPENDENT_AMBULATORY_CARE_PROVIDER_SITE_OTHER): Payer: 59 | Admitting: Family Medicine

## 2020-05-20 ENCOUNTER — Encounter: Payer: Self-pay | Admitting: Family Medicine

## 2020-05-20 VITALS — BP 128/79 | HR 101 | Ht 63.0 in | Wt 160.0 lb

## 2020-05-20 DIAGNOSIS — F411 Generalized anxiety disorder: Secondary | ICD-10-CM | POA: Diagnosis not present

## 2020-05-20 DIAGNOSIS — F321 Major depressive disorder, single episode, moderate: Secondary | ICD-10-CM

## 2020-05-20 MED ORDER — ESCITALOPRAM OXALATE 10 MG PO TABS: ORAL_TABLET | ORAL | 1 refills | Status: DC

## 2020-05-20 NOTE — Progress Notes (Signed)
Established Patient Office Visit  Subjective:  Patient ID: Sandra Cross, female    DOB: Sep 13, 1996  Age: 24 y.o. MRN: [id number]  CC:  Chief Complaint  Patient presents with  . mood    HPI Sandra Cross presents for f/u new start Abilify.  She had been on on it previously and did well. Thinks she was on either 5 or 10 mg before.  She stated that she was taking her medication at 7 AM but it caused her to be very drowsy so she began taking it at 430 PM and she now feels that it is wearing off. She also feels that her anxiety is more heightened since she has been taking her medications.   She says she feels motivated to go to work she actually really likes work but then as soon as she gets home she just wants to go to bed she is been trying to make herself get out a little bit more.  History reviewed. No pertinent past medical history.  Past Surgical History:  Procedure Laterality Date  . NO PAST SURGERIES      Family History  Problem Relation Age of Onset  . Cancer Paternal Grandmother     Social History   Socioeconomic History  . Marital status: Single    Spouse name: Not on file  . Number of children: Not on file  . Years of education: HS   . Highest education level: Not on file  Occupational History  . Occupation: Engineer, materials     Comment: [employer]   Tobacco Use  . Smoking status: Current Every Day Smoker    Packs/day: 0.25    Types: Cigarettes    Start date: 09/09/2013  . Smokeless tobacco: Never Used  Substance and Sexual Activity  . Alcohol use: Yes    Alcohol/week: 0.0 standard drinks  . Drug use: No  . Sexual activity: Yes    Partners: Male    Birth control/protection: Pill  Other Topics Concern  . Not on file  Social History Narrative   No regular exercise.  Lives with her mom and her boyfriend.    Social Determinants of Health   Financial Resource Strain:   . Difficulty of Paying Living Expenses: Not on file  Food Insecurity:   .  Worried About Charity fundraiser in the Last Year: Not on file  . Ran Out of Food in the Last Year: Not on file  Transportation Needs:   . Lack of Transportation (Medical): Not on file  . Lack of Transportation (Non-Medical): Not on file  Physical Activity:   . Days of Exercise per Week: Not on file  . Minutes of Exercise per Session: Not on file  Stress:   . Feeling of Stress : Not on file  Social Connections:   . Frequency of Communication with Friends and Family: Not on file  . Frequency of Social Gatherings with Friends and Family: Not on file  . Attends Religious Services: Not on file  . Active Member of Clubs or Organizations: Not on file  . Attends Archivist Meetings: Not on file  . Marital Status: Not on file  Intimate Partner Violence:   . Fear of Current or Ex-Partner: Not on file  . Emotionally Abused: Not on file  . Physically Abused: Not on file  . Sexually Abused: Not on file    Outpatient Medications Prior to Visit  Medication Sig Dispense Refill  . ARIPiprazole (ABILIFY) 2 MG tablet  Take 1 tablet (2 mg total) by mouth daily. 30 tablet 0   No facility-administered medications prior to visit.    Allergies  Allergen Reactions  . Aripiprazole Nausea Only  . Venlafaxine Nausea Only    ROS Review of Systems    Objective:    Physical Exam Constitutional:      Appearance: She is well-developed.  HENT:     Head: Normocephalic and atraumatic.  Cardiovascular:     Rate and Rhythm: Normal rate and regular rhythm.     Heart sounds: Normal heart sounds.  Pulmonary:     Effort: Pulmonary effort is normal.     Breath sounds: Normal breath sounds.  Skin:    General: Skin is warm and dry.  Neurological:     Mental Status: She is alert and oriented to person, place, and time.  Psychiatric:        Behavior: Behavior normal.     BP 128/79   Pulse (!) 101   Ht $R'5\' 3"'HA$  (1.6 m)   Wt 160 lb (72.6 kg)   SpO2 100%   BMI 28.34 kg/m  Wt Readings from  Last 3 Encounters:  05/20/20 160 lb (72.6 kg)  05/02/20 159 lb (72.1 kg)  01/11/18 143 lb (64.9 kg)     Health Maintenance Due  Topic Date Due  . Hepatitis C Screening  Never done  . PAP-Cervical Cytology Screening  Never done  . PAP SMEAR-Modifier  Never done  . CHLAMYDIA SCREENING  01/01/2019  . INFLUENZA VACCINE  04/28/2020    There are no preventive care reminders to display for this patient.  No results found for: TSH No results found for: WBC, HGB, HCT, MCV, PLT No results found for: NA, K, CHLORIDE, CO2, GLUCOSE, BUN, CREATININE, BILITOT, ALKPHOS, AST, ALT, PROT, ALBUMIN, CALCIUM, ANIONGAP, EGFR, GFR No results found for: CHOL No results found for: HDL No results found for: LDLCALC No results found for: TRIG No results found for: CHOLHDL No results found for: HGBA1C    Assessment & Plan:   Problem List Items Addressed This Visit      Other   Moderate single current episode of major depressive disorder ([place]) - Primary    We will add an SSRI to her current regimen we will start Lexapro 10.  She will start with half a tab daily for 6 days and then go up to a whole tab we will see her back in 3 weeks.  We could consider increasing her Abilify to 5 mg as she had been taking previously but she is already feeling a little sluggish and sedated on it some little hesitant to go up on it too quickly.      Relevant Medications   escitalopram (LEXAPRO) 10 MG tablet   GAD (generalized anxiety disorder)    Discussed that the Abilify is probably not going to make any significant changes in her current anxiety levels.  GAD-7 score of 16 and she rates it a somewhat difficult.  Discussed starting an SSRI in addition to the Abilify.  We will start Lexapro which she has never taken before.  Follow-up in 3 weeks.      Relevant Medications   escitalopram (LEXAPRO) 10 MG tablet      Meds ordered this encounter  Medications  . escitalopram (LEXAPRO) 10 MG tablet    Sig: 1/2 tab po  QD x 6 days the increase to whole tab QD    Dispense:  30 tablet    Refill:  1    Follow-up: Return in about 3 weeks (around 06/10/2020) for New start medication.   Spent in encounter 20 minutes.  Beatrice Lecher, MD

## 2020-05-20 NOTE — Assessment & Plan Note (Signed)
We will add an SSRI to her current regimen we will start Lexapro 10.  She will start with half a tab daily for 6 days and then go up to a whole tab we will see her back in 3 weeks.  We could consider increasing her Abilify to 5 mg as she had been taking previously but she is already feeling a little sluggish and sedated on it some little hesitant to go up on it too quickly.

## 2020-05-20 NOTE — Progress Notes (Signed)
She stated that she was taking her medication at 7 AM but it caused her to be very drowsy so she began taking it at 430 PM and she now feels that it is wearing off. She also feels that her anxiety is more heightened since she has been taking her medications.

## 2020-05-20 NOTE — Assessment & Plan Note (Signed)
Discussed that the Abilify is probably not going to make any significant changes in her current anxiety levels.  GAD-7 score of 16 and she rates it a somewhat difficult.  Discussed starting an SSRI in addition to the Abilify.  We will start Lexapro which she has never taken before.  Follow-up in 3 weeks.

## 2020-06-01 ENCOUNTER — Other Ambulatory Visit: Payer: Self-pay | Admitting: Family Medicine

## 2020-06-01 DIAGNOSIS — F411 Generalized anxiety disorder: Secondary | ICD-10-CM

## 2020-06-01 DIAGNOSIS — F321 Major depressive disorder, single episode, moderate: Secondary | ICD-10-CM

## 2020-06-10 ENCOUNTER — Other Ambulatory Visit: Payer: Self-pay

## 2020-06-10 ENCOUNTER — Ambulatory Visit: Payer: 59 | Admitting: Psychology

## 2020-06-10 ENCOUNTER — Encounter: Payer: Self-pay | Admitting: Family Medicine

## 2020-06-10 ENCOUNTER — Ambulatory Visit (INDEPENDENT_AMBULATORY_CARE_PROVIDER_SITE_OTHER): Payer: 59 | Admitting: Family Medicine

## 2020-06-10 VITALS — BP 114/70 | HR 83 | Ht 63.0 in | Wt 162.0 lb

## 2020-06-10 DIAGNOSIS — F411 Generalized anxiety disorder: Secondary | ICD-10-CM | POA: Diagnosis not present

## 2020-06-10 DIAGNOSIS — F321 Major depressive disorder, single episode, moderate: Secondary | ICD-10-CM

## 2020-06-10 NOTE — Progress Notes (Signed)
Established Patient Office Visit  Subjective:  Patient ID: Sandra Cross, female    DOB: Aug 10, 1996  Age: 24 y.o. MRN: [id number]  CC:  Chief Complaint  Patient presents with  . mood    HPI Sandra Cross presents for f/U mood.  Follow-up depression/anxiety-she was on Abilify 10 mg which was started over the summer and then when I last saw her in August we decided to add escitalopram to better control her symptoms she was worried that going up on the Abilify might actually cause more sedation she says she still sleeping a lot in general but not as much as she was she has felt a little bit more motivated she feels like in general she has been feeling well except for 2 recent events that were really difficult.  One was her ex-boyfriend came back and got all of his things after he had moved out.  And then she actually met a new guy and after he spent the weekend with her he basically said he only wanted to be friends.  Still struggling with feeling down and depressed more than half the days.  Still rates pretty high with feeling anxious and difficulty controlling her worry.  Though she has made a therapy appointment and actually will start later this week.  History reviewed. No pertinent past medical history.  Past Surgical History:  Procedure Laterality Date  . NO PAST SURGERIES      Family History  Problem Relation Age of Onset  . Cancer Paternal Grandmother     Social History   Socioeconomic History  . Marital status: Single    Spouse name: Not on file  . Number of children: Not on file  . Years of education: HS   . Highest education level: Not on file  Occupational History  . Occupation: Engineer, materials     Comment: [employer]   Tobacco Use  . Smoking status: Current Every Day Smoker    Packs/day: 0.25    Types: Cigarettes    Start date: 09/09/2013  . Smokeless tobacco: Never Used  Substance and Sexual Activity  . Alcohol use: Yes    Alcohol/week: 0.0 standard  drinks  . Drug use: No  . Sexual activity: Yes    Partners: Male    Birth control/protection: Pill  Other Topics Concern  . Not on file  Social History Narrative   No regular exercise.  Lives with her mom and her boyfriend.    Social Determinants of Health   Financial Resource Strain:   . Difficulty of Paying Living Expenses: Not on file  Food Insecurity:   . Worried About Charity fundraiser in the Last Year: Not on file  . Ran Out of Food in the Last Year: Not on file  Transportation Needs:   . Lack of Transportation (Medical): Not on file  . Lack of Transportation (Non-Medical): Not on file  Physical Activity:   . Days of Exercise per Week: Not on file  . Minutes of Exercise per Session: Not on file  Stress:   . Feeling of Stress : Not on file  Social Connections:   . Frequency of Communication with Friends and Family: Not on file  . Frequency of Social Gatherings with Friends and Family: Not on file  . Attends Religious Services: Not on file  . Active Member of Clubs or Organizations: Not on file  . Attends Archivist Meetings: Not on file  . Marital Status: Not on file  Intimate Partner Violence:   . Fear of Current or Ex-Partner: Not on file  . Emotionally Abused: Not on file  . Physically Abused: Not on file  . Sexually Abused: Not on file    Outpatient Medications Prior to Visit  Medication Sig Dispense Refill  . ARIPiprazole (ABILIFY) 2 MG tablet TAKE 1 TABLET BY MOUTH EVERY DAY 30 tablet 0  . escitalopram (LEXAPRO) 10 MG tablet 1/2 tab po QD x 6 days the increase to whole tab QD 30 tablet 1   No facility-administered medications prior to visit.    Allergies  Allergen Reactions  . Aripiprazole Nausea Only  . Venlafaxine Nausea Only    ROS Review of Systems    Objective:    Physical Exam  BP 114/70   Pulse 83   Ht _0  (1.6 m)   Wt 162 lb (73.5 kg)   SpO2 100%   BMI 28.70 kg/m  Wt Readings from Last 3 Encounters:  06/10/20 162 lb  (73.5 kg)  05/20/20 160 lb (72.6 kg)  05/02/20 159 lb (72.1 kg)     There are no preventive care reminders to display for this patient.  There are no preventive care reminders to display for this patient.  No results found for: TSH No results found for: WBC, HGB, HCT, MCV, PLT No results found for: NA, K, CHLORIDE, CO2, GLUCOSE, BUN, CREATININE, BILITOT, ALKPHOS, AST, ALT, PROT, ALBUMIN, CALCIUM, ANIONGAP, EGFR, GFR No results found for: CHOL No results found for: HDL No results found for: LDLCALC No results found for: TRIG No results found for: CHOLHDL No results found for: HGBA1C    Assessment & Plan:   Problem List Items Addressed This Visit      Other   Moderate single current episode of major depressive disorder ([place]) - Primary    Improved.  Continue current regimen.  Discussed options.  Continue current regimen.  Follow-up in about 2 to 3 months.  She can call back if she is having any problems she has her first appointment with the therapist later this week and just encouraged her to really try to be open to therapy.      GAD (generalized anxiety disorder)      No orders of the defined types were placed in this encounter.   Follow-up: Return in about 10 weeks (around 08/19/2020) for Mood medication .   Time spent in encounter 20 minutes.  Beatrice Lecher, MD

## 2020-06-10 NOTE — Assessment & Plan Note (Addendum)
Improved.  Continue current regimen.  Discussed options.  Continue current regimen.  Follow-up in about 2 to 3 months.  She can call back if she is having any problems she has her first appointment with the therapist later this week and just encouraged her to really try to be open to therapy.

## 2020-06-11 ENCOUNTER — Ambulatory Visit (INDEPENDENT_AMBULATORY_CARE_PROVIDER_SITE_OTHER): Payer: 59 | Admitting: Psychology

## 2020-06-11 DIAGNOSIS — F1020 Alcohol dependence, uncomplicated: Secondary | ICD-10-CM

## 2020-06-13 ENCOUNTER — Ambulatory Visit (INDEPENDENT_AMBULATORY_CARE_PROVIDER_SITE_OTHER): Payer: 59 | Admitting: Psychology

## 2020-06-13 DIAGNOSIS — F1020 Alcohol dependence, uncomplicated: Secondary | ICD-10-CM

## 2020-06-17 ENCOUNTER — Ambulatory Visit (INDEPENDENT_AMBULATORY_CARE_PROVIDER_SITE_OTHER): Payer: 59 | Admitting: Psychology

## 2020-06-17 DIAGNOSIS — F1020 Alcohol dependence, uncomplicated: Secondary | ICD-10-CM | POA: Diagnosis not present

## 2020-06-24 ENCOUNTER — Ambulatory Visit (INDEPENDENT_AMBULATORY_CARE_PROVIDER_SITE_OTHER): Payer: 59 | Admitting: Psychology

## 2020-06-24 DIAGNOSIS — F1020 Alcohol dependence, uncomplicated: Secondary | ICD-10-CM

## 2020-06-25 ENCOUNTER — Telehealth (INDEPENDENT_AMBULATORY_CARE_PROVIDER_SITE_OTHER): Payer: 59 | Admitting: Family Medicine

## 2020-06-25 VITALS — Wt 156.0 lb

## 2020-06-25 DIAGNOSIS — J029 Acute pharyngitis, unspecified: Secondary | ICD-10-CM | POA: Diagnosis not present

## 2020-06-25 MED ORDER — PENICILLIN V POTASSIUM 500 MG PO TABS: 500.0000 mg | ORAL_TABLET | Freq: Two times a day (BID) | ORAL | 0 refills | Status: DC

## 2020-06-25 NOTE — Progress Notes (Signed)
Virtual Visit via Video Note  I connected with Sandra Cross on 06/25/20 at  2:40 PM EDT by a video enabled telemedicine application and verified that I am speaking with the correct person using two identifiers.   I discussed the limitations of evaluation and management by telemedicine and the availability of in person appointments. The patient expressed understanding and agreed to proceed.  Patient location: at home Provider location: in office  Subjective:    CC: ST  HPI: Patient reports waking yesterday to vomiting and a red, swollen throat.  She had a temperature of 99.6.  Today, her temperature has returned to normal and she denies any nausea, vomiting, diarrhea.  She does report "cold sweats" today.  She denies being around anyone with known strep.  She said she did look in the back of her throat with a flashlight and it does look red but no white spots.  She says both sides of her neck are tender to touch.   Past medical history, Surgical history, Family history not pertinant except as noted below, Social history, Allergies, and medications have been entered into the medical record, reviewed, and corrections made.   Review of Systems: No fevers, chills, night sweats, weight loss, chest pain, or shortness of breath.   Objective:    General: Speaking clearly in complete sentences without any shortness of breath.  Alert and oriented x3.  Normal judgment. No apparent acute distress.    Impression and Recommendations:    No problem-specific Assessment & Plan notes found for this encounter.  Pharyngitis-possible strep throat.  Only way to definitively diagnose would be to swab her.  But I think it is reasonable to treat her if not improving then consider may not be bacterial it may be viral.  If develops any new symptoms or worsening symptoms and please give [place] call back.  No other sinus symptoms or respiratory symptoms at this point to indicate Covid.    Time spent in  encounter 15 minutes  I discussed the assessment and treatment plan with the patient. The patient was provided an opportunity to ask questions and all were answered. The patient agreed with the plan and demonstrated an understanding of the instructions.   The patient was advised to call back or seek an in-person evaluation if the symptoms worsen or if the condition fails to improve as anticipated.   Nani Gasser, MD

## 2020-06-25 NOTE — Progress Notes (Signed)
Patient reports waking yesterday to vomiting and a red, swollen throat.  She had a temperature of 99.6.  Today, her temperature has returned to normal and she denies any nausea, vomiting, diarrhea.  She does report "cold sweats" today.  She denies being around anyone with known strep.

## 2020-06-27 ENCOUNTER — Other Ambulatory Visit: Payer: Self-pay | Admitting: Family Medicine

## 2020-06-27 ENCOUNTER — Ambulatory Visit: Payer: 59 | Admitting: Family Medicine

## 2020-06-27 DIAGNOSIS — F321 Major depressive disorder, single episode, moderate: Secondary | ICD-10-CM

## 2020-06-27 DIAGNOSIS — F411 Generalized anxiety disorder: Secondary | ICD-10-CM

## 2020-07-01 ENCOUNTER — Ambulatory Visit: Payer: 59 | Admitting: Medical-Surgical

## 2020-07-04 ENCOUNTER — Ambulatory Visit (INDEPENDENT_AMBULATORY_CARE_PROVIDER_SITE_OTHER): Payer: 59 | Admitting: Psychology

## 2020-07-04 DIAGNOSIS — F1020 Alcohol dependence, uncomplicated: Secondary | ICD-10-CM

## 2020-07-14 ENCOUNTER — Other Ambulatory Visit: Payer: Self-pay | Admitting: Family Medicine

## 2020-07-15 ENCOUNTER — Ambulatory Visit (INDEPENDENT_AMBULATORY_CARE_PROVIDER_SITE_OTHER): Payer: 59 | Admitting: Psychology

## 2020-07-15 DIAGNOSIS — F1020 Alcohol dependence, uncomplicated: Secondary | ICD-10-CM | POA: Diagnosis not present

## 2020-07-29 ENCOUNTER — Ambulatory Visit (INDEPENDENT_AMBULATORY_CARE_PROVIDER_SITE_OTHER): Payer: 59 | Admitting: Psychology

## 2020-07-29 DIAGNOSIS — F1020 Alcohol dependence, uncomplicated: Secondary | ICD-10-CM | POA: Diagnosis not present

## 2020-08-06 ENCOUNTER — Ambulatory Visit (INDEPENDENT_AMBULATORY_CARE_PROVIDER_SITE_OTHER): Payer: 59 | Admitting: Family Medicine

## 2020-08-06 ENCOUNTER — Encounter: Payer: Self-pay | Admitting: Family Medicine

## 2020-08-06 ENCOUNTER — Other Ambulatory Visit: Payer: Self-pay

## 2020-08-06 VITALS — BP 112/62 | HR 98 | Ht 63.0 in | Wt 158.0 lb

## 2020-08-06 DIAGNOSIS — G43819 Other migraine, intractable, without status migrainosus: Secondary | ICD-10-CM | POA: Diagnosis not present

## 2020-08-06 DIAGNOSIS — G43909 Migraine, unspecified, not intractable, without status migrainosus: Secondary | ICD-10-CM | POA: Insufficient documentation

## 2020-08-06 MED ORDER — KETOROLAC TROMETHAMINE 60 MG/2ML IM SOLN
60.0000 mg | Freq: Once | INTRAMUSCULAR | Status: AC
  Administered 2020-08-06: 60 mg via INTRAMUSCULAR

## 2020-08-06 MED ORDER — ELETRIPTAN HYDROBROMIDE 20 MG PO TABS: 20.0000 mg | ORAL_TABLET | ORAL | 3 refills | Status: DC | PRN

## 2020-08-06 NOTE — Progress Notes (Signed)
Acute Office Visit  Subjective:    Patient ID: OZA OBERLE, female    DOB: 11/24/1995, 24 y.o.   MRN: [id number]  Chief Complaint  Patient presents with  . Migraine    HPI Patient is in today for the worst headache of her life.  Pt reports that she has been experiencing a headache x 1 wk, but it got worse yesterday. She has sensitivity to sound,denies any n/v.  Does have some light sensitivity.  She says she actually has had a low-grade headache all week but then yesterday while at work it suddenly got worse.  She denies any upper respiratory symptoms.  No head trauma or injury etc.  She says the headache right now is bilateral and temporal.  But earlier today it was more frontal.  Scribes it as throbbing.  He rates her headache a 10 out of 10 today.  She has been taking IBU 1,000 mg BID which did not help. She said that when she went to sleep this did help her but after waking the headache returned. The pain is at her temples and and above her eyes and forehead. She said this was the worse headache she has ever had.  She does not have a history of migraines her mother does but does report when she was younger she did experience chronic headaches.   Says it hurts bad enough today that he feels like she just wants to cry.  No past medical history on file.  Past Surgical History:  Procedure Laterality Date  . NO PAST SURGERIES      Family History  Problem Relation Age of Onset  . Cancer Paternal Grandmother     Social History   Socioeconomic History  . Marital status: Single    Spouse name: Not on file  . Number of children: Not on file  . Years of education: HS   . Highest education level: Not on file  Occupational History  . Occupation: Engineer, materials     Comment: [employer]   Tobacco Use  . Smoking status: Current Every Day Smoker    Packs/day: 0.25    Types: Cigarettes    Start date: 09/09/2013  . Smokeless tobacco: Never Used  Substance and Sexual Activity   . Alcohol use: Yes    Alcohol/week: 0.0 standard drinks  . Drug use: No  . Sexual activity: Yes    Partners: Male    Birth control/protection: Pill  Other Topics Concern  . Not on file  Social History Narrative   No regular exercise.  Lives with her mom and her boyfriend.    Social Determinants of Health   Financial Resource Strain:   . Difficulty of Paying Living Expenses: Not on file  Food Insecurity:   . Worried About Charity fundraiser in the Last Year: Not on file  . Ran Out of Food in the Last Year: Not on file  Transportation Needs:   . Lack of Transportation (Medical): Not on file  . Lack of Transportation (Non-Medical): Not on file  Physical Activity:   . Days of Exercise per Week: Not on file  . Minutes of Exercise per Session: Not on file  Stress:   . Feeling of Stress : Not on file  Social Connections:   . Frequency of Communication with Friends and Family: Not on file  . Frequency of Social Gatherings with Friends and Family: Not on file  . Attends Religious Services: Not on file  . Active Member  of Clubs or Organizations: Not on file  . Attends Archivist Meetings: Not on file  . Marital Status: Not on file  Intimate Partner Violence:   . Fear of Current or Ex-Partner: Not on file  . Emotionally Abused: Not on file  . Physically Abused: Not on file  . Sexually Abused: Not on file    Outpatient Medications Prior to Visit  Medication Sig Dispense Refill  . ARIPiprazole (ABILIFY) 2 MG tablet TAKE 1 TABLET BY MOUTH EVERY DAY 30 tablet 0  . escitalopram (LEXAPRO) 10 MG tablet Take 1 tablet (10 mg total) by mouth daily. 30 tablet 1  . penicillin v potassium (VEETID) 500 MG tablet Take 1 tablet (500 mg total) by mouth in the morning and at bedtime. 20 tablet 0   No facility-administered medications prior to visit.    Allergies  Allergen Reactions  . Aripiprazole Nausea Only  . Venlafaxine Nausea Only    Review of Systems     Objective:     Physical Exam Constitutional:      Appearance: She is well-developed.  HENT:     Head: Normocephalic and atraumatic.     Right Ear: External ear normal.     Left Ear: External ear normal.     Nose: Nose normal.  Eyes:     Conjunctiva/sclera: Conjunctivae normal.     Pupils: Pupils are equal, round, and reactive to light.  Neck:     Thyroid: No thyromegaly.  Cardiovascular:     Rate and Rhythm: Normal rate and regular rhythm.     Heart sounds: Normal heart sounds.  Pulmonary:     Effort: Pulmonary effort is normal.     Breath sounds: Normal breath sounds. No wheezing.  Musculoskeletal:     Cervical back: Neck supple.  Lymphadenopathy:     Cervical: No cervical adenopathy.  Skin:    General: Skin is warm and dry.  Neurological:     Mental Status: She is alert and oriented to person, place, and time.  Psychiatric:        Behavior: Behavior normal.     BP 112/62   Pulse 98   Ht $R'5\' 3"'fn$  (1.6 m)   Wt 158 lb (71.7 kg)   LMP 07/29/2020 (Exact Date)   SpO2 99%   BMI 27.99 kg/m  Wt Readings from Last 3 Encounters:  08/06/20 158 lb (71.7 kg)  06/25/20 156 lb (70.8 kg)  06/10/20 162 lb (73.5 kg)    There are no preventive care reminders to display for this patient.  There are no preventive care reminders to display for this patient.   No results found for: TSH No results found for: WBC, HGB, HCT, MCV, PLT No results found for: NA, K, CHLORIDE, CO2, GLUCOSE, BUN, CREATININE, BILITOT, ALKPHOS, AST, ALT, PROT, ALBUMIN, CALCIUM, ANIONGAP, EGFR, GFR No results found for: CHOL No results found for: HDL No results found for: LDLCALC No results found for: TRIG No results found for: CHOLHDL No results found for: HGBA1C     Assessment & Plan:   Problem List Items Addressed This Visit      Cardiovascular and Mediastinum   Migraine - Primary   Relevant Medications   eletriptan (RELPAX) 20 MG tablet     Migraine headache, acute-I do think she has migraine headaches  based on her history though this sounds like it is the most severe headache.  We discussed a Toradol injection here in the office and then going home and resting.  Will provide work note for today.  Also discussed sending her in something to take as needed if her typical treatment with Motrin and Tylenol does not help.  It looks like Relpax is covered on her plan so we will send that over to the pharmacy.  We can discuss other treatment options as well.  We also discussed getting plenty of rest, reducing stress levels, eating healthy, getting regular exercise and avoiding triggers such as caffeine, chocolate, nuts, aged cheeses, wine.  Meds ordered this encounter  Medications  . eletriptan (RELPAX) 20 MG tablet    Sig: Take 1 tablet (20 mg total) by mouth as needed for migraine or headache. May repeat in 2 hours if headache persists or recurs.    Dispense:  10 tablet    Refill:  3  . ketorolac (TORADOL) injection 60 mg     Beatrice Lecher, MD

## 2020-08-06 NOTE — Progress Notes (Signed)
Pt reports that she has been experiencing a headache x 1 wk but the migraine began 1 day ago. She has sensitivity to sound,denies any n/v,light sensitivity.   She has been taking IBU 1,000 mg BID which did not help. She said that when she went to sleep this did help her but after waking the headache returned. The pain is at her temples and and above her eyes and forehead. She said this was the worse headache she has ever had.  She does not have a history of migraines her mother does but does report when she was younger she did experience chronic headaches.

## 2020-08-12 ENCOUNTER — Ambulatory Visit: Payer: 59 | Admitting: Psychology

## 2020-08-19 ENCOUNTER — Ambulatory Visit: Payer: 59 | Admitting: Family Medicine

## 2020-08-26 ENCOUNTER — Ambulatory Visit: Payer: 59 | Admitting: Psychology

## 2020-09-09 ENCOUNTER — Ambulatory Visit: Payer: 59 | Admitting: Psychology

## 2020-09-23 ENCOUNTER — Ambulatory Visit: Payer: 59 | Admitting: Psychology

## 2020-10-07 ENCOUNTER — Ambulatory Visit: Payer: 59 | Admitting: Psychology

## 2020-10-21 ENCOUNTER — Ambulatory Visit: Payer: 59 | Admitting: Psychology

## 2020-11-04 ENCOUNTER — Ambulatory Visit: Payer: 59 | Admitting: Psychology

## 2020-11-05 ENCOUNTER — Ambulatory Visit (INDEPENDENT_AMBULATORY_CARE_PROVIDER_SITE_OTHER): Payer: 59 | Admitting: Family Medicine

## 2020-11-05 ENCOUNTER — Other Ambulatory Visit (HOSPITAL_COMMUNITY)
Admission: RE | Admit: 2020-11-05 | Discharge: 2020-11-05 | Disposition: A | Discharge status: Home / Self Care | Payer: 59 | Source: Ambulatory Visit | Attending: Family Medicine | Admitting: Family Medicine

## 2020-11-05 ENCOUNTER — Encounter: Payer: Self-pay | Admitting: Family Medicine

## 2020-11-05 ENCOUNTER — Other Ambulatory Visit: Payer: Self-pay

## 2020-11-05 VITALS — BP 117/70 | HR 90 | Ht 63.0 in | Wt 159.0 lb

## 2020-11-05 DIAGNOSIS — Z124 Encounter for screening for malignant neoplasm of cervix: Secondary | ICD-10-CM | POA: Diagnosis not present

## 2020-11-05 DIAGNOSIS — Z113 Encounter for screening for infections with a predominantly sexual mode of transmission: Secondary | ICD-10-CM | POA: Diagnosis not present

## 2020-11-05 NOTE — Progress Notes (Signed)
Established Patient Office Visit  Subjective:  Patient ID: Sandra Cross, female    DOB: 07/14/1996  Age: 25 y.o. MRN: [id number]  CC:  Chief Complaint  Patient presents with  . std testing    HPI Sandra Cross presents for STD testing and she is also due for her Pap smear.  She declines to have any blood drawn for HIV or syphilis.  He is not currently having any pelvic pain or problems or abnormal discharge.  No past medical history on file.  Past Surgical History:  Procedure Laterality Date  . NO PAST SURGERIES      Family History  Problem Relation Age of Onset  . Cancer Paternal Grandmother     Social History   Socioeconomic History  . Marital status: Single    Spouse name: Not on file  . Number of children: Not on file  . Years of education: HS   . Highest education level: Not on file  Occupational History  . Occupation: Engineer, materials     Comment: [employer]   Tobacco Use  . Smoking status: Current Every Day Smoker    Packs/day: 0.25    Types: Cigarettes    Start date: 09/09/2013  . Smokeless tobacco: Never Used  Substance and Sexual Activity  . Alcohol use: Yes    Alcohol/week: 0.0 standard drinks  . Drug use: No  . Sexual activity: Yes    Partners: Male    Birth control/protection: Pill  Other Topics Concern  . Not on file  Social History Narrative   No regular exercise.  Lives with her mom and her boyfriend.    Social Determinants of Health   Financial Resource Strain: Not on file  Food Insecurity: Not on file  Transportation Needs: Not on file  Physical Activity: Not on file  Stress: Not on file  Social Connections: Not on file  Intimate Partner Violence: Not on file    Outpatient Medications Prior to Visit  Medication Sig Dispense Refill  . ARIPiprazole (ABILIFY) 2 MG tablet TAKE 1 TABLET BY MOUTH EVERY DAY 30 tablet 0  . eletriptan (RELPAX) 20 MG tablet Take 1 tablet (20 mg total) by mouth as needed for migraine or headache. May  repeat in 2 hours if headache persists or recurs. 10 tablet 3  . escitalopram (LEXAPRO) 10 MG tablet Take 1 tablet (10 mg total) by mouth daily. 30 tablet 1   No facility-administered medications prior to visit.    Allergies  Allergen Reactions  . Aripiprazole Nausea Only  . Venlafaxine Nausea Only    ROS Review of Systems    Objective:    Physical Exam  BP 117/70   Pulse 90   Ht $R'5\' 3"'sL$  (1.6 m)   Wt 159 lb (72.1 kg)   SpO2 100%   BMI 28.17 kg/m  Wt Readings from Last 3 Encounters:  11/05/20 159 lb (72.1 kg)  08/06/20 158 lb (71.7 kg)  06/25/20 156 lb (70.8 kg)     There are no preventive care reminders to display for this patient.  There are no preventive care reminders to display for this patient.  No results found for: TSH No results found for: WBC, HGB, HCT, MCV, PLT No results found for: NA, K, CHLORIDE, CO2, GLUCOSE, BUN, CREATININE, BILITOT, ALKPHOS, AST, ALT, PROT, ALBUMIN, CALCIUM, ANIONGAP, EGFR, GFR No results found for: CHOL No results found for: HDL No results found for: LDLCALC No results found for: TRIG No results found for: CHOLHDL No  results found for: HGBA1C    Assessment & Plan:   Problem List Items Addressed This Visit   None   Visit Diagnoses    Routine screening for STI (sexually transmitted infection)    -  Primary   Relevant Orders   C. trachomatis/N. gonorrhoeae RNA   WET PREP FOR [place], YEAST, CLUE   Screening for cervical cancer       Relevant Orders   Cytology - PAP      Pap smear performed today patient tolerated well should get report back in about a week we will give her a call back STD testing performed.  She declined to have the HPV RPR and hepatitis C via blood draw today.  We can certainly do those later if she would like.  No orders of the defined types were placed in this encounter.   Follow-up: Return if symptoms worsen or fail to improve.    Beatrice Lecher, MD

## 2020-11-06 LAB — WET PREP FOR TRICH, YEAST, CLUE
MICRO NUMBER:: 11508288
Specimen Quality: ADEQUATE

## 2020-11-06 LAB — C. TRACHOMATIS/N. GONORRHOEAE RNA
C. trachomatis RNA, TMA: NOT DETECTED
N. gonorrhoeae RNA, TMA: NOT DETECTED

## 2020-11-13 LAB — CYTOLOGY - PAP
Comment: NEGATIVE
Comment: NEGATIVE
HPV 16: NEGATIVE
HPV 18 / 45: NEGATIVE
High risk HPV: POSITIVE — AB

## 2020-11-14 ENCOUNTER — Other Ambulatory Visit: Payer: Self-pay | Admitting: *Deleted

## 2020-11-14 DIAGNOSIS — R87612 Low grade squamous intraepithelial lesion on cytologic smear of cervix (LGSIL): Secondary | ICD-10-CM

## 2020-11-18 ENCOUNTER — Ambulatory Visit: Payer: 59 | Admitting: Psychology

## 2020-12-02 ENCOUNTER — Ambulatory Visit: Payer: 59 | Admitting: Psychology

## 2020-12-02 ENCOUNTER — Encounter: Payer: 59 | Admitting: Obstetrics and Gynecology

## 2020-12-02 ENCOUNTER — Telehealth: Payer: Self-pay

## 2020-12-02 NOTE — Telephone Encounter (Signed)
Dr.Davis reviewed pt chart and let staff know that pt needs repeat pap smear in one year and it was up to pt if she wanted to come in for her appt today to discuss that or if she wanted to cancel appt and just come in one year for pap. Pt states she wants to cancel appt today and will schedule in one year.

## 2020-12-16 ENCOUNTER — Ambulatory Visit: Payer: 59 | Admitting: Psychology

## 2020-12-30 ENCOUNTER — Ambulatory Visit: Payer: 59 | Admitting: Psychology

## 2021-04-29 ENCOUNTER — Encounter: Payer: Self-pay | Admitting: Family Medicine

## 2021-04-29 ENCOUNTER — Other Ambulatory Visit: Payer: Self-pay

## 2021-04-29 ENCOUNTER — Ambulatory Visit: Payer: 59 | Admitting: Family Medicine

## 2021-04-29 VITALS — BP 121/53 | HR 91 | Temp 98.8°F | Ht 63.0 in | Wt 164.0 lb

## 2021-04-29 DIAGNOSIS — L03012 Cellulitis of left finger: Secondary | ICD-10-CM | POA: Diagnosis not present

## 2021-04-29 MED ORDER — SULFAMETHOXAZOLE-TRIMETHOPRIM 800-160 MG PO TABS: 1.0000 | ORAL_TABLET | Freq: Two times a day (BID) | ORAL | 0 refills | Status: DC

## 2021-04-29 NOTE — Progress Notes (Signed)
Acute Office Visit  Subjective:    Patient ID: Sandra Cross, female    DOB: 1996/02/03, 25 y.o.   MRN: [id number]  Chief Complaint  Patient presents with   infected fingernail    HPI Patient is in today for nail injury about 5 days ago.  She has artificial nails in place and they caught on something on Friday it pulled the nail back to the point that it ripped the nail bed and it started bleeding the nail is still attached.  She says since then she has bumped it a few more times and now it is red and hot and swollen.  She has not noticed any pus or drainage.  History reviewed. No pertinent past medical history.  Past Surgical History:  Procedure Laterality Date   NO PAST SURGERIES      Family History  Problem Relation Age of Onset   Cancer Paternal Grandmother     Social History   Socioeconomic History   Marital status: Single    Spouse name: Not on file   Number of children: Not on file   Years of education: HS    Highest education level: Not on file  Occupational History   Occupation: Therapist, sports     Comment: [employer]   Tobacco Use   Smoking status: Every Day    Packs/day: 0.25    Types: Cigarettes    Start date: 09/09/2013   Smokeless tobacco: Never  Substance and Sexual Activity   Alcohol use: Yes    Alcohol/week: 0.0 standard drinks   Drug use: No   Sexual activity: Yes    Partners: Male    Birth control/protection: Pill  Other Topics Concern   Not on file  Social History Narrative   No regular exercise.  Lives with her mom and her boyfriend.    Social Determinants of Health   Financial Resource Strain: Not on file  Food Insecurity: Not on file  Transportation Needs: Not on file  Physical Activity: Not on file  Stress: Not on file  Social Connections: Not on file  Intimate Partner Violence: Not on file    No outpatient medications prior to visit.   No facility-administered medications prior to visit.    Allergies  Allergen  Reactions   Aripiprazole Nausea Only   Venlafaxine Nausea Only    Review of Systems     Objective:    Physical Exam  DIP of the fourth finger on the right hand from the base of the nailbed all the way to the tip of the finger is erythematous.  No active drainage with compression.  It is warm to touch and swollen.  There is some dried blood around the edges of the nail.  Normal range of motion of the DIP.  BP (!) 121/53   Pulse 91   Temp 98.8 F (37.1 C) (Oral)   Ht 5\' 3"  (1.6 m)   Wt 164 lb (74.4 kg)   LMP 04/23/2021 (Exact Date)   SpO2 100% Comment: on RA  BMI 29.05 kg/m  Wt Readings from Last 3 Encounters:  04/29/21 164 lb (74.4 kg)  11/05/20 159 lb (72.1 kg)  08/06/20 158 lb (71.7 kg)    Health Maintenance Due  Topic Date Due   Pneumococcal Vaccine 64-52 Years old (2 - PCV) 02/19/2000   INFLUENZA VACCINE  04/28/2021    There are no preventive care reminders to display for this patient.   No results found for: TSH No results found  for: WBC, HGB, HCT, MCV, PLT No results found for: NA, K, CHLORIDE, CO2, GLUCOSE, BUN, CREATININE, BILITOT, ALKPHOS, AST, ALT, PROT, ALBUMIN, CALCIUM, ANIONGAP, EGFR, GFR No results found for: CHOL No results found for: HDL No results found for: LDLCALC No results found for: TRIG No results found for: CHOLHDL No results found for: HGBA1C     Assessment & Plan:   Problem List Items Addressed This Visit   None Visit Diagnoses     Infection of nail bed of finger of left hand    -  Primary   Relevant Medications   sulfamethoxazole-trimethoprim (BACTRIM DS) 800-160 MG tablet      Infected nail.  We discussed that at this point I think she would actually benefit from an antibiotic also encouraged her to trim the artificial nail down so it does not continue to get caught or pulled.  If at any point is not improving or she develops any pus or drainage then let us know as we may have to actually remove the nail.  Right now it is a  little difficult to say how much of it just inflammation and how much of it could be a secondary infection.  Meds ordered this encounter  Medications   sulfamethoxazole-trimethoprim (BACTRIM DS) 800-160 MG tablet    Sig: Take 1 tablet by mouth 2 (two) times daily.    Dispense:  14 tablet    Refill:  0     Beatrice Lecher, MD

## 2021-08-07 DIAGNOSIS — Z6831 Body mass index (BMI) 31.0-31.9, adult: Secondary | ICD-10-CM | POA: Insufficient documentation

## 2021-08-12 DIAGNOSIS — F1991 Other psychoactive substance use, unspecified, in remission: Secondary | ICD-10-CM | POA: Insufficient documentation

## 2021-10-29 DIAGNOSIS — R87612 Low grade squamous intraepithelial lesion on cytologic smear of cervix (LGSIL): Secondary | ICD-10-CM | POA: Insufficient documentation

## 2022-02-20 ENCOUNTER — Encounter: Payer: Self-pay | Admitting: Family Medicine

## 2022-02-20 LAB — CHLAMYDIA TRACHOMATIS DNA, QUANTITATIVE

## 2022-02-20 LAB — GONOCOCCUS DNA, PCR

## 2022-06-04 ENCOUNTER — Ambulatory Visit (INDEPENDENT_AMBULATORY_CARE_PROVIDER_SITE_OTHER): Payer: 59

## 2022-06-04 ENCOUNTER — Other Ambulatory Visit: Payer: Self-pay | Admitting: Family Medicine

## 2022-06-04 ENCOUNTER — Ambulatory Visit (INDEPENDENT_AMBULATORY_CARE_PROVIDER_SITE_OTHER): Payer: 59 | Admitting: Family Medicine

## 2022-06-04 VITALS — BP 102/70 | HR 84 | Temp 98.6°F | Ht 63.0 in | Wt 163.0 lb

## 2022-06-04 DIAGNOSIS — R1011 Right upper quadrant pain: Secondary | ICD-10-CM | POA: Diagnosis not present

## 2022-06-04 DIAGNOSIS — K8020 Calculus of gallbladder without cholecystitis without obstruction: Secondary | ICD-10-CM

## 2022-06-04 DIAGNOSIS — R112 Nausea with vomiting, unspecified: Secondary | ICD-10-CM

## 2022-06-04 NOTE — Assessment & Plan Note (Addendum)
-   VSS - positive murphy sign on exam and with some correlation with food and radiation of pain do have a concern for cholecystitis  - no fevers so less likely there is infection but would like to get cbc to check white count to rule this out.  - have also gotten CMP to evaluate for kidney function and electrolyte function.  - being that pain has been persistent for 3 months less likely this is pancreatitis due to length of symptoms - have ordered RUQ [place] to eval for cholecystitis pt does have risk factors of overweight and fertile

## 2022-06-04 NOTE — Progress Notes (Signed)
Acute Office Visit  Subjective:     Patient ID: Sandra Cross, female    DOB: 05-Nov-1995, 26 y.o.   MRN: [id number]  Chief Complaint  Patient presents with   Abdominal Pain    Abdominal Pain Pertinent negatives include no fever or headaches.   Patient is in today for 3 months of abdominal pain that radiates to her back. She had a vaginal delivery without complications and noticed the pain started after birth. Pain has gotten more severe and included vomiting. Denies fevers, chills. Does not note any changes in lifestyle since giving birth. Denies alcohol use. She said last night it happened an hour after she ate dinner. She does note some correlation with foods.   Review of Systems  Constitutional:  Negative for chills and fever.  Respiratory:  Negative for cough and shortness of breath.   Cardiovascular:  Negative for chest pain.  Gastrointestinal:  Positive for abdominal pain.  Neurological:  Negative for headaches.      Objective:    BP 102/70   Pulse 84   Temp 98.6 F (37 C)   Ht 5\' 3"  (1.6 m)   Wt 163 lb (73.9 kg)   SpO2 100%   BMI 28.87 kg/m    Physical Exam Vitals and nursing note reviewed.  Constitutional:      General: She is not in acute distress.    Appearance: Normal appearance.  HENT:     Head: Normocephalic and atraumatic.     Right Ear: External ear normal.     Left Ear: External ear normal.     Nose: Nose normal.  Eyes:     Conjunctiva/sclera: Conjunctivae normal.  Cardiovascular:     Rate and Rhythm: Normal rate and regular rhythm.  Pulmonary:     Effort: Pulmonary effort is normal.     Breath sounds: Normal breath sounds.  Abdominal:     General: Abdomen is flat.     Comments: Positive Murphy sign. Negative Rovsing. Negative tenderness to palpation of all four quadrants.   Neurological:     General: No focal deficit present.     Mental Status: She is alert and oriented to person, place, and time.  Psychiatric:        Mood and  Affect: Mood normal.        Behavior: Behavior normal.        Thought Content: Thought content normal.        Judgment: Judgment normal.     No results found for any visits on 06/04/22.     Assessment & Plan:   Problem List Items Addressed This Visit       Other   RUQ pain - Primary    - VSS - positive murphy sign on exam and with some correlation with food and radiation of pain do have a concern for cholecystitis  - no fevers so less likely there is infection but would like to get cbc to check white count to rule this out.  - have also gotten CMP to evaluate for kidney function and electrolyte function.  - being that pain has been persistent for 3 months less likely this is pancreatitis due to length of symptoms - have ordered RUQ 08/04/22 to eval for cholecystitis pt does have risk factors of overweight and fertile       Relevant Orders   US ABDOMEN LIMITED RUQ (LIVER/GB)   CBC   COMPLETE METABOLIC PANEL WITH GFR    No orders of the  defined types were placed in this encounter.   Return if symptoms worsen or fail to improve.  Charlton Amor, DO

## 2022-06-05 ENCOUNTER — Telehealth: Payer: Self-pay | Admitting: General Practice

## 2022-06-05 LAB — COMPLETE METABOLIC PANEL WITH GFR
AG Ratio: 1.7 (calc) (ref 1.0–2.5)
ALT: 17 U/L (ref 6–29)
AST: 16 U/L (ref 10–30)
Albumin: 4.3 g/dL (ref 3.6–5.1)
Alkaline phosphatase (APISO): 87 U/L (ref 31–125)
BUN: 11 mg/dL (ref 7–25)
CO2: 26 mmol/L (ref 20–32)
Calcium: 9.4 mg/dL (ref 8.6–10.2)
Chloride: 106 mmol/L (ref 98–110)
Creat: 0.76 mg/dL (ref 0.50–0.96)
Globulin: 2.6 g/dL (calc) (ref 1.9–3.7)
Glucose, Bld: 84 mg/dL (ref 65–99)
Potassium: 4.2 mmol/L (ref 3.5–5.3)
Sodium: 140 mmol/L (ref 135–146)
Total Bilirubin: 0.4 mg/dL (ref 0.2–1.2)
Total Protein: 6.9 g/dL (ref 6.1–8.1)
eGFR: 111 mL/min/{1.73_m2} (ref >=60)

## 2022-06-05 LAB — CBC
HCT: 38.2 % (ref 35.0–45.0)
Hemoglobin: 12.8 g/dL (ref 11.7–15.5)
MCH: 27.5 pg (ref 27.0–33.0)
MCHC: 33.5 g/dL (ref 32.0–36.0)
MCV: 82.2 fL (ref 80.0–100.0)
MPV: 8.8 fL (ref 7.5–12.5)
Platelets: 443 10*3/uL — ABNORMAL HIGH (ref 140–400)
RBC: 4.65 10*6/uL (ref 3.80–5.10)
RDW: 14.4 % (ref 11.0–15.0)
WBC: 7.9 10*3/uL (ref 3.8–10.8)

## 2022-06-05 NOTE — Telephone Encounter (Signed)
Transition Care Management Follow-up Telephone Call Date of discharge and from where: 06/04/22 from [facility] How have you been since you were released from the hospital? Patient had OV with Dr. Tamera Punt on 06/04/22. Any questions or concerns? No

## 2022-06-08 ENCOUNTER — Encounter: Payer: Self-pay | Admitting: Family Medicine

## 2022-06-10 ENCOUNTER — Ambulatory Visit: Payer: Self-pay | Admitting: General Surgery

## 2022-06-10 DIAGNOSIS — K8020 Calculus of gallbladder without cholecystitis without obstruction: Secondary | ICD-10-CM | POA: Insufficient documentation

## 2022-06-22 ENCOUNTER — Ambulatory Visit (INDEPENDENT_AMBULATORY_CARE_PROVIDER_SITE_OTHER): Payer: 59 | Admitting: Family Medicine

## 2022-06-22 ENCOUNTER — Encounter: Payer: Self-pay | Admitting: Family Medicine

## 2022-06-22 VITALS — BP 115/64 | HR 107 | Ht 63.0 in | Wt 164.0 lb

## 2022-06-22 DIAGNOSIS — Z309 Encounter for contraceptive management, unspecified: Secondary | ICD-10-CM | POA: Diagnosis not present

## 2022-06-22 NOTE — Progress Notes (Signed)
   Established Patient Office Visit  Subjective   Patient ID: Sandra Cross, female    DOB: 10-Nov-1995  Age: 26 y.o. MRN: [id number]  Chief Complaint  Patient presents with   Contraception    HPI  She had a baby about 3 months ago.  Right now she has been on the minipill but has not been happy with that she just keeps forgetting to take it.  She is here today to discuss her options she does vape and so they have indicated that she is not a good candidate for an estrogen-containing birth control.  She is not interested in having any children in the next 3 years.  He is also scheduled for gallbladder surgery in October.  She started having problems after her pregnancy.  ROS    Objective:     BP 115/64   Pulse (!) 107   Ht 5\' 3"  (1.6 m)   Wt 164 lb (74.4 kg)   LMP 06/18/2022 (Exact Date)   SpO2 99%   BMI 29.05 kg/m     Physical Exam Vitals reviewed.  Constitutional:      Appearance: She is well-developed.  HENT:     Head: Normocephalic and atraumatic.  Eyes:     Conjunctiva/sclera: Conjunctivae normal.  Cardiovascular:     Rate and Rhythm: Normal rate.  Pulmonary:     Effort: Pulmonary effort is normal.  Skin:    General: Skin is dry.     Coloration: Skin is not pale.  Neurological:     Mental Status: She is alert and oriented to person, place, and time.  Psychiatric:        Behavior: Behavior normal.      No results found for any visits on 06/22/22.     The ASCVD Risk score (Arnett DK, et al., 2019) failed to calculate for the following reasons:   The 2019 ASCVD risk score is only valid for ages 18 to 17    Assessment & Plan:   Problem List Items Addressed This Visit   None Visit Diagnoses     Encounter for contraceptive management, unspecified type    -  Primary      Contraceptive counseling-we reviewed all of her nonestrogen options for birth control.  She is most interested in [place].  We will get her scheduled with Samuel Bouche our nurse  practitioner to have it placed.  She is not currently sexually active.  And she is not nursing.  Return for Please stop up front to schedule the Nexplanon insertion with Samuel Bouche our nurse practitioner..   I spent 20 minutes on the day of the encounter to include pre-visit record review, face-to-face time with the patient and post visit ordering of test.   Beatrice Lecher, MD

## 2022-06-22 NOTE — Addendum Note (Signed)
Addended by: Teddy Spike on: 06/22/2022 04:00 PM   Modules accepted: Orders

## 2022-06-23 LAB — C. TRACHOMATIS/N. GONORRHOEAE RNA
C. trachomatis RNA, TMA: NOT DETECTED
N. gonorrhoeae RNA, TMA: NOT DETECTED

## 2022-06-24 NOTE — Progress Notes (Signed)
Negative for gonorrhea and chlamydia

## 2022-06-30 NOTE — Patient Instructions (Signed)
SURGICAL WAITING ROOM VISITATION Patients having surgery or a procedure may have no more than 2 support people in the waiting area - these visitors may rotate.   Children under the age of 2 must have an adult with them who is not the patient. If the patient needs to stay at the hospital during part of their recovery, the visitor guidelines for inpatient rooms apply. Pre-op nurse will coordinate an appropriate time for 1 support person to accompany patient in pre-op.  This support person may not rotate.    Please refer to the [facility] website for the visitor guidelines for Inpatients (after your surgery is over and you are in a regular room).       Your procedure is scheduled on:  07/08/2022    Report to [facility] Main Entrance    Report to admitting at   [phone]   Call this number if you have problems the morning of surgery [phone]   Do not eat food :After Midnight.   After Midnight you may have the following liquids until ____ 0530__ AM DAY OF SURGERY  Water Non-Citrus Juices (without pulp, NO RED) Carbonated Beverages Black Coffee (NO MILK/CREAM OR CREAMERS, sugar ok)  Clear Tea (NO MILK/CREAM OR CREAMERS, sugar ok) regular and decaf                             Plain Jell-O (NO RED)                                           Fruit ices (not with fruit pulp, NO RED)                                     Popsicles (NO RED)                                                               Sports drinks like Gatorade (NO RED)                            If you have questions, please contact your surgeon's office.        Oral Hygiene is also important to reduce your risk of infection.                                    Remember - BRUSH YOUR TEETH THE MORNING OF SURGERY WITH YOUR REGULAR TOOTHPASTE   Do NOT smoke after Midnight   Take these medicines the morning of surgery with A SIP OF WATER:  none   DO NOT TAKE ANY ORAL DIABETIC MEDICATIONS DAY OF YOUR  SURGERY  Bring CPAP mask and tubing day of surgery.                              You may not have any metal on your body including hair pins, jewelry, and body  piercing             Do not wear make-up, lotions, powders, perfumes/cologne, or deodorant  Do not wear nail polish including gel and S&S, artificial/acrylic nails, or any other type of covering on natural nails including finger and toenails. If you have artificial nails, gel coating, etc. that needs to be removed by a nail salon please have this removed prior to surgery or surgery may need to be canceled/ delayed if the surgeon/ anesthesia feels like they are unable to be safely monitored.   Do not shave  48 hours prior to surgery.               Men may shave face and neck.   Do not bring valuables to the hospital. [place].   Contacts, dentures or bridgework may not be worn into surgery.   Bring small overnight bag day of surgery.   DO NOT [place]. PHARMACY WILL DISPENSE MEDICATIONS LISTED ON YOUR MEDICATION LIST TO YOU DURING YOUR ADMISSION [place]!    Patients discharged on the day of surgery will not be allowed to drive home.  Someone NEEDS to stay with you for the first 24 hours after anesthesia.   Special Instructions: Bring a copy of your healthcare power of attorney and living will documents the day of surgery if you haven't scanned them before.              Please read over the following fact sheets you were given: IF [place] [phone]   If you received a COVID test during your pre-op visit  it is requested that you wear a mask when out in public, stay away from anyone that may not be feeling well and notify your surgeon if you develop symptoms. If you test positive for Covid or have been in contact with anyone that has tested positive in the last 10 days please notify  you surgeon.     [place] - Preparing for Surgery Before surgery, you can play an important role.  Because skin is not sterile, your skin needs to be as free of germs as possible.  You can reduce the number of germs on your skin by washing with CHG (chlorahexidine gluconate) soap before surgery.  CHG is an antiseptic cleaner which kills germs and bonds with the skin to continue killing germs even after washing. Please DO NOT use if you have an allergy to CHG or antibacterial soaps.  If your skin becomes reddened/irritated stop using the CHG and inform your nurse when you arrive at Short Stay. Do not shave (including legs and underarms) for at least 48 hours prior to the first CHG shower.  You may shave your face/neck. Please follow these instructions carefully:  1.  Shower with CHG Soap the night before surgery and the  morning of Surgery.  2.  If you choose to wash your hair, wash your hair first as usual with your  normal  shampoo.  3.  After you shampoo, rinse your hair and body thoroughly to remove the  shampoo.                           4.  Use CHG as you would any other liquid soap.  You can apply  chg directly  to the skin and wash                       Gently with a scrungie or clean washcloth.  5.  Apply the CHG Soap to your body ONLY FROM THE NECK DOWN.   Do not use on face/ open                           Wound or open sores. Avoid contact with eyes, ears mouth and genitals (private parts).                       Wash face,  Genitals (private parts) with your normal soap.             6.  Wash thoroughly, paying special attention to the area where your surgery  will be performed.  7.  Thoroughly rinse your body with warm water from the neck down.  8.  DO NOT shower/wash with your normal soap after using and rinsing off  the CHG Soap.                9.  Pat yourself dry with a clean towel.            10.  Wear clean pajamas.            11.  Place clean sheets on your bed the night of your  first shower and do not  sleep with pets. Day of Surgery : Do not apply any lotions/deodorants the morning of surgery.  Please wear clean clothes to the hospital/surgery center.  FAILURE TO FOLLOW THESE INSTRUCTIONS MAY RESULT IN THE CANCELLATION OF YOUR SURGERY PATIENT SIGNATURE_________________________________  NURSE SIGNATURE__________________________________  ________________________________________________________________________

## 2022-06-30 NOTE — Progress Notes (Addendum)
Anesthesia Review:  PCP: Beatrice Lecher LOv 06/22/22  Cardiologist : none  Chest x-ray : EKG : Echo : Stress test: Cardiac Cath :  Activity level: can do a flight of stairs wtihout difficutly  Sleep Study/ CPAP : none  Fasting Blood Sugar :      / Checks Blood Sugar -- times a day:   Blood Thinner/ Instructions /Last Dose: ASA / Instructions/ Last Dose :  PT at preop with infant daughter.  Currently not breastfeeding   PT vapes every day. Aware of preop instructoins.   PT brought infant child to preop appt.  PT made aware at preop that infant child would not be allowed in Short STay on [place].

## 2022-07-03 ENCOUNTER — Other Ambulatory Visit: Payer: Self-pay

## 2022-07-03 ENCOUNTER — Encounter (HOSPITAL_COMMUNITY): Payer: Self-pay

## 2022-07-03 ENCOUNTER — Encounter (HOSPITAL_COMMUNITY)
Admission: RE | Admit: 2022-07-03 | Discharge: 2022-07-03 | Disposition: A | Discharge status: Home / Self Care | Payer: 59 | Source: Ambulatory Visit | Attending: General Surgery | Admitting: General Surgery

## 2022-07-03 VITALS — BP 116/72 | HR 74 | Temp 98.4°F | Resp 16 | Ht 63.0 in | Wt 153.0 lb

## 2022-07-03 DIAGNOSIS — Z01818 Encounter for other preprocedural examination: Secondary | ICD-10-CM

## 2022-07-03 DIAGNOSIS — Z01812 Encounter for preprocedural laboratory examination: Secondary | ICD-10-CM | POA: Diagnosis present

## 2022-07-03 LAB — CBC
HCT: 35.9 % — ABNORMAL LOW (ref 36.0–46.0)
Hemoglobin: 11.7 g/dL — ABNORMAL LOW (ref 12.0–15.0)
MCH: 27.7 pg (ref 26.0–34.0)
MCHC: 32.6 g/dL (ref 30.0–36.0)
MCV: 85.1 fL (ref 80.0–100.0)
Platelets: 368 10*3/uL (ref 150–400)
RBC: 4.22 MIL/uL (ref 3.87–5.11)
RDW: 13.5 % (ref 11.5–15.5)
WBC: 6.9 10*3/uL (ref 4.0–10.5)
nRBC: 0 % (ref 0.0–0.2)

## 2022-07-08 ENCOUNTER — Ambulatory Visit (HOSPITAL_COMMUNITY)
Admission: RE | Admit: 2022-07-08 | Discharge: 2022-07-08 | Disposition: A | Discharge status: Home / Self Care | Payer: 59 | Source: Ambulatory Visit | Attending: General Surgery | Admitting: General Surgery

## 2022-07-08 ENCOUNTER — Encounter (HOSPITAL_COMMUNITY)
Admission: RE | Disposition: A | Discharge status: Home / Self Care | Payer: Self-pay | Source: Ambulatory Visit | Attending: General Surgery

## 2022-07-08 ENCOUNTER — Ambulatory Visit (HOSPITAL_COMMUNITY): Payer: 59

## 2022-07-08 ENCOUNTER — Ambulatory Visit (HOSPITAL_COMMUNITY): Payer: 59 | Admitting: Physician Assistant

## 2022-07-08 ENCOUNTER — Other Ambulatory Visit: Payer: Self-pay

## 2022-07-08 ENCOUNTER — Ambulatory Visit (HOSPITAL_BASED_OUTPATIENT_CLINIC_OR_DEPARTMENT_OTHER): Payer: 59 | Admitting: Anesthesiology

## 2022-07-08 ENCOUNTER — Encounter (HOSPITAL_COMMUNITY): Payer: Self-pay | Admitting: General Surgery

## 2022-07-08 DIAGNOSIS — K8020 Calculus of gallbladder without cholecystitis without obstruction: Secondary | ICD-10-CM

## 2022-07-08 DIAGNOSIS — F418 Other specified anxiety disorders: Secondary | ICD-10-CM | POA: Insufficient documentation

## 2022-07-08 DIAGNOSIS — Z87891 Personal history of nicotine dependence: Secondary | ICD-10-CM | POA: Insufficient documentation

## 2022-07-08 DIAGNOSIS — K8010 Calculus of gallbladder with chronic cholecystitis without obstruction: Secondary | ICD-10-CM | POA: Diagnosis present

## 2022-07-08 DIAGNOSIS — Z01818 Encounter for other preprocedural examination: Secondary | ICD-10-CM

## 2022-07-08 HISTORY — PX: CHOLECYSTECTOMY: SHX55

## 2022-07-08 LAB — POCT PREGNANCY, URINE: Preg Test, Ur: NEGATIVE

## 2022-07-08 SURGERY — LAPAROSCOPIC CHOLECYSTECTOMY WITH INTRAOPERATIVE CHOLANGIOGRAM
Anesthesia: General | Site: Abdomen

## 2022-07-08 MED ORDER — FENTANYL CITRATE (PF) 100 MCG/2ML IJ SOLN
INTRAMUSCULAR | Status: AC
  Filled 2022-07-08: qty 2

## 2022-07-08 MED ORDER — ONDANSETRON HCL 4 MG/2ML IJ SOLN
INTRAMUSCULAR | Status: AC
  Filled 2022-07-08: qty 2

## 2022-07-08 MED ORDER — LACTATED RINGERS IV SOLN: INTRAVENOUS | Status: DC

## 2022-07-08 MED ORDER — PROMETHAZINE HCL 25 MG/ML IJ SOLN: 6.2500 mg | INTRAMUSCULAR | Status: DC | PRN

## 2022-07-08 MED ORDER — PROPOFOL 10 MG/ML IV BOLUS
INTRAVENOUS | Status: AC
  Filled 2022-07-08: qty 20

## 2022-07-08 MED ORDER — ROCURONIUM BROMIDE 10 MG/ML (PF) SYRINGE
PREFILLED_SYRINGE | INTRAVENOUS | Status: AC
  Filled 2022-07-08: qty 10

## 2022-07-08 MED ORDER — HYDROMORPHONE HCL 1 MG/ML IJ SOLN
INTRAMUSCULAR | Status: AC
  Administered 2022-07-08: 0.5 mg via INTRAVENOUS
  Filled 2022-07-08: qty 1

## 2022-07-08 MED ORDER — CHLORHEXIDINE GLUCONATE CLOTH 2 % EX PADS: 6.0000 | MEDICATED_PAD | Freq: Once | CUTANEOUS | Status: DC

## 2022-07-08 MED ORDER — ROCURONIUM BROMIDE 10 MG/ML (PF) SYRINGE
PREFILLED_SYRINGE | INTRAVENOUS | Status: DC | PRN
  Administered 2022-07-08: 50 mg via INTRAVENOUS

## 2022-07-08 MED ORDER — MIDAZOLAM HCL 2 MG/2ML IJ SOLN
INTRAMUSCULAR | Status: AC
  Filled 2022-07-08: qty 2

## 2022-07-08 MED ORDER — CELECOXIB 200 MG PO CAPS
200.0000 mg | ORAL_CAPSULE | ORAL | Status: AC
  Administered 2022-07-08: 200 mg via ORAL
  Filled 2022-07-08: qty 1

## 2022-07-08 MED ORDER — HYDROMORPHONE HCL 1 MG/ML IJ SOLN: 0.2500 mg | INTRAMUSCULAR | Status: DC | PRN

## 2022-07-08 MED ORDER — AMISULPRIDE (ANTIEMETIC) 5 MG/2ML IV SOLN: 10.0000 mg | Freq: Once | INTRAVENOUS | Status: DC | PRN

## 2022-07-08 MED ORDER — BUPIVACAINE LIPOSOME 1.3 % IJ SUSP
INTRAMUSCULAR | Status: AC
  Filled 2022-07-08: qty 20

## 2022-07-08 MED ORDER — ORAL CARE MOUTH RINSE: 15.0000 mL | Freq: Once | OROMUCOSAL | Status: AC

## 2022-07-08 MED ORDER — ACETAMINOPHEN 500 MG PO TABS
1000.0000 mg | ORAL_TABLET | ORAL | Status: AC
  Administered 2022-07-08: 1000 mg via ORAL
  Filled 2022-07-08: qty 2

## 2022-07-08 MED ORDER — OXYCODONE HCL 5 MG PO TABS: 5.0000 mg | ORAL_TABLET | Freq: Once | ORAL | Status: AC | PRN

## 2022-07-08 MED ORDER — CHLORHEXIDINE GLUCONATE 0.12 % MT SOLN
15.0000 mL | Freq: Once | OROMUCOSAL | Status: AC
  Administered 2022-07-08: 15 mL via OROMUCOSAL

## 2022-07-08 MED ORDER — DEXAMETHASONE SODIUM PHOSPHATE 10 MG/ML IJ SOLN
INTRAMUSCULAR | Status: AC
  Filled 2022-07-08: qty 1

## 2022-07-08 MED ORDER — LIDOCAINE 2% (20 MG/ML) 5 ML SYRINGE
INTRAMUSCULAR | Status: DC | PRN
  Administered 2022-07-08: 100 mg via INTRAVENOUS

## 2022-07-08 MED ORDER — MIDAZOLAM HCL 5 MG/5ML IJ SOLN
INTRAMUSCULAR | Status: DC | PRN
  Administered 2022-07-08: 2 mg via INTRAVENOUS

## 2022-07-08 MED ORDER — BUPIVACAINE-EPINEPHRINE (PF) 0.25% -1:200000 IJ SOLN
INTRAMUSCULAR | Status: AC
  Filled 2022-07-08: qty 30

## 2022-07-08 MED ORDER — SODIUM CHLORIDE (PF) 0.9 % IJ SOLN
INTRAMUSCULAR | Status: DC | PRN
  Administered 2022-07-08: 6 mL

## 2022-07-08 MED ORDER — OXYCODONE HCL 5 MG/5ML PO SOLN: 5.0000 mg | Freq: Once | ORAL | Status: AC | PRN

## 2022-07-08 MED ORDER — SUGAMMADEX SODIUM 200 MG/2ML IV SOLN
INTRAVENOUS | Status: DC | PRN
  Administered 2022-07-08: 200 mg via INTRAVENOUS

## 2022-07-08 MED ORDER — OXYCODONE HCL 5 MG PO TABS: 5.0000 mg | ORAL_TABLET | Freq: Four times a day (QID) | ORAL | 0 refills | Status: DC | PRN

## 2022-07-08 MED ORDER — ONDANSETRON HCL 4 MG/2ML IJ SOLN
INTRAMUSCULAR | Status: DC | PRN
  Administered 2022-07-08: 4 mg via INTRAVENOUS

## 2022-07-08 MED ORDER — MEPERIDINE HCL 50 MG/ML IJ SOLN
INTRAMUSCULAR | Status: AC
  Filled 2022-07-08: qty 1

## 2022-07-08 MED ORDER — LACTATED RINGERS IR SOLN
Status: DC | PRN
  Administered 2022-07-08: 1000 mL

## 2022-07-08 MED ORDER — OXYCODONE HCL 5 MG PO TABS
ORAL_TABLET | ORAL | Status: AC
  Administered 2022-07-08: 5 mg via ORAL
  Filled 2022-07-08: qty 1

## 2022-07-08 MED ORDER — CEFAZOLIN SODIUM-DEXTROSE 2-4 GM/100ML-% IV SOLN
2.0000 g | INTRAVENOUS | Status: AC
  Administered 2022-07-08: 2 g via INTRAVENOUS
  Filled 2022-07-08: qty 100

## 2022-07-08 MED ORDER — DEXAMETHASONE SODIUM PHOSPHATE 10 MG/ML IJ SOLN
INTRAMUSCULAR | Status: DC | PRN
  Administered 2022-07-08: 10 mg via INTRAVENOUS

## 2022-07-08 MED ORDER — GABAPENTIN 300 MG PO CAPS
300.0000 mg | ORAL_CAPSULE | ORAL | Status: AC
  Administered 2022-07-08: 300 mg via ORAL
  Filled 2022-07-08: qty 1

## 2022-07-08 MED ORDER — PROPOFOL 10 MG/ML IV BOLUS
INTRAVENOUS | Status: DC | PRN
  Administered 2022-07-08: 150 mg via INTRAVENOUS

## 2022-07-08 MED ORDER — LIDOCAINE HCL (PF) 2 % IJ SOLN
INTRAMUSCULAR | Status: AC
  Filled 2022-07-08: qty 5

## 2022-07-08 MED ORDER — 0.9 % SODIUM CHLORIDE (POUR BTL) OPTIME
TOPICAL | Status: DC | PRN
  Administered 2022-07-08: 1000 mL

## 2022-07-08 MED ORDER — MEPERIDINE HCL 50 MG/ML IJ SOLN
6.2500 mg | INTRAMUSCULAR | Status: DC | PRN
  Administered 2022-07-08: 12.5 mg via INTRAVENOUS

## 2022-07-08 MED ORDER — BUPIVACAINE-EPINEPHRINE 0.25% -1:200000 IJ SOLN
INTRAMUSCULAR | Status: DC | PRN
  Administered 2022-07-08: 26 mL

## 2022-07-08 MED ORDER — FENTANYL CITRATE (PF) 100 MCG/2ML IJ SOLN
INTRAMUSCULAR | Status: DC | PRN
  Administered 2022-07-08 (×4): 50 ug via INTRAVENOUS

## 2022-07-08 SURGICAL SUPPLY — 37 items
ADH SKN CLS APL DERMABOND .7 (GAUZE/BANDAGES/DRESSINGS) ×2
APPLIER CLIP 5 13 M/L LIGAMAX5 (MISCELLANEOUS) ×2
APR CLP MED LRG 5 ANG JAW (MISCELLANEOUS) ×2
BAG COUNTER SPONGE SURGICOUNT (BAG) IMPLANT
BAG SPNG CNTER NS LX DISP (BAG)
CATH REDDICK CHOLANGI 4FR 50CM (CATHETERS) ×3 IMPLANT
CLIP APPLIE 5 13 M/L LIGAMAX5 (MISCELLANEOUS) ×3 IMPLANT
COVER MAYO STAND STRL (DRAPES) ×3 IMPLANT
DERMABOND ADVANCED .7 DNX12 (GAUZE/BANDAGES/DRESSINGS) ×3 IMPLANT
DRAPE C-ARM 42X120 X-RAY (DRAPES) ×3 IMPLANT
ELECT REM PT RETURN 15FT ADLT (MISCELLANEOUS) ×3 IMPLANT
GLOVE BIO SURGEON STRL SZ7.5 (GLOVE) ×6 IMPLANT
GOWN STRL REUS W/ TWL LRG LVL3 (GOWN DISPOSABLE) ×3 IMPLANT
GOWN STRL REUS W/ TWL XL LVL3 (GOWN DISPOSABLE) ×3 IMPLANT
GOWN STRL REUS W/TWL LRG LVL3 (GOWN DISPOSABLE) ×2
GOWN STRL REUS W/TWL XL LVL3 (GOWN DISPOSABLE) ×2
HEMOSTAT SURGICEL 4X8 (HEMOSTASIS) ×2 IMPLANT
IRRIG SUCT STRYKERFLOW 2 WTIP (MISCELLANEOUS) ×2
IRRIGATION SUCT STRKRFLW 2 WTP (MISCELLANEOUS) ×3 IMPLANT
IV CATH 14GX2 1/4 (CATHETERS) ×3 IMPLANT
KIT BASIN OR (CUSTOM PROCEDURE TRAY) ×3 IMPLANT
KIT TURNOVER KIT A (KITS) ×1 IMPLANT
PAD POSITIONING PINK XL (MISCELLANEOUS) IMPLANT
PENCIL SMOKE EVACUATOR (MISCELLANEOUS) IMPLANT
PROTECTOR NERVE ULNAR (MISCELLANEOUS) IMPLANT
SCISSORS LAP 5X35 DISP (ENDOMECHANICALS) ×1 IMPLANT
SET TUBE SMOKE EVAC HIGH FLOW (TUBING) IMPLANT
SLEEVE Z-THREAD 5X100MM (TROCAR) ×6 IMPLANT
SPIKE FLUID TRANSFER (MISCELLANEOUS) ×2 IMPLANT
SUT MNCRL AB 4-0 PS2 18 (SUTURE) ×3 IMPLANT
SYS BAG RETRIEVAL 10MM (BASKET)
SYSTEM BAG RETRIEVAL 10MM (BASKET) IMPLANT
TAPE CLOTH 4X10 WHT NS (GAUZE/BANDAGES/DRESSINGS) IMPLANT
TOWEL OR 17X26 10 PK STRL BLUE (TOWEL DISPOSABLE) ×3 IMPLANT
TRAY LAPAROSCOPIC (CUSTOM PROCEDURE TRAY) ×3 IMPLANT
TROCAR BALLN 12MMX100 BLUNT (TROCAR) ×3 IMPLANT
TROCAR Z-THREAD OPTICAL 5X100M (TROCAR) ×3 IMPLANT

## 2022-07-08 NOTE — Interval H&P Note (Signed)
History and Physical Interval Note:  07/08/2022 7:52 AM  Sandra Cross  has presented today for surgery, with the diagnosis of GALLSTONES.  The various methods of treatment have been discussed with the patient and family. After consideration of risks, benefits and other options for treatment, the patient has consented to  Procedure(s): LAPAROSCOPIC CHOLECYSTECTOMY (N/A) as a surgical intervention.  The patient's history has been reviewed, patient examined, no change in status, stable for surgery.  I have reviewed the patient's chart and labs.  Questions were answered to the patient's satisfaction.     [name]

## 2022-07-08 NOTE — Anesthesia Preprocedure Evaluation (Signed)
Anesthesia Evaluation  Patient identified by MRN, date of birth, ID band Patient awake    Reviewed: Allergy & Precautions, NPO status , Patient's Chart, lab work & pertinent test results  Airway Mallampati: II  TM Distance: >3 FB Neck ROM: Full    Dental no notable dental hx.    Pulmonary neg pulmonary ROS, Patient abstained from smoking., former smoker,    Pulmonary exam normal breath sounds clear to auscultation       Cardiovascular negative cardio ROS Normal cardiovascular exam Rhythm:Regular Rate:Normal     Neuro/Psych  Headaches, Anxiety Depression negative psych ROS   GI/Hepatic negative GI ROS, Neg liver ROS,   Endo/Other  negative endocrine ROS  Renal/GU negative Renal ROS  negative genitourinary   Musculoskeletal negative musculoskeletal ROS (+)   Abdominal   Peds negative pediatric ROS (+)  Hematology negative hematology ROS (+)   Anesthesia Other Findings   Reproductive/Obstetrics negative OB ROS                             Anesthesia Physical Anesthesia Plan  ASA: 2  Anesthesia Plan: General   Post-op Pain Management: Tylenol PO (pre-op)*, Gabapentin PO (pre-op)*, Celebrex PO (pre-op)* and Dilaudid IV   Induction: Intravenous  PONV Risk Score and Plan: 3 and Ondansetron, Dexamethasone, Midazolam and Treatment may vary due to age or medical condition  Airway Management Planned: Oral ETT  Additional Equipment:   Intra-op Plan:   Post-operative Plan: Extubation in OR  Informed Consent: I have reviewed the patients History and Physical, chart, labs and discussed the procedure including the risks, benefits and alternatives for the proposed anesthesia with the patient or authorized representative who has indicated his/her understanding and acceptance.     Dental advisory given  Plan Discussed with: CRNA  Anesthesia Plan Comments:         Anesthesia Quick  Evaluation

## 2022-07-08 NOTE — Transfer of Care (Signed)
Immediate Anesthesia Transfer of Care Note  Patient: Sandra Cross  Procedure(s) Performed: LAPAROSCOPIC CHOLECYSTECTOMY WITH INTRAOPERATIVE CHOLANGIOGRAM (Abdomen)  Patient Location: PACU  Anesthesia Type:General  Level of Consciousness: sedated  Airway & Oxygen Therapy: Patient Spontanous Breathing and Patient connected to face mask oxygen  Post-op Assessment: Report given to RN and Post -op Vital signs reviewed and stable  Post vital signs: Reviewed and stable  Last Vitals:  Vitals Value Taken Time  BP 144/97 07/08/22 0934  Temp    Pulse 115 07/08/22 0936  Resp 24 07/08/22 0936  SpO2 100 % 07/08/22 0936  Vitals shown include unvalidated device data.  Last Pain:  Vitals:   07/08/22 0705  TempSrc: Oral  PainSc:          Complications: No notable events documented.

## 2022-07-08 NOTE — Anesthesia Procedure Notes (Signed)
Procedure Name: Intubation Date/Time: 07/08/2022 8:30 AM  Performed by: Lind Covert, CRNAPre-anesthesia Checklist: Patient identified, Emergency Drugs available, Suction available, Patient being monitored and Timeout performed Patient Re-evaluated:Patient Re-evaluated prior to induction Oxygen Delivery Method: Circle system utilized Preoxygenation: Pre-oxygenation with 100% oxygen Induction Type: IV induction Ventilation: Mask ventilation without difficulty Laryngoscope Size: Mac and 3 Grade View: Grade I Tube size: 7.0 mm Number of attempts: 1 Airway Equipment and Method: Stylet Placement Confirmation: ETT inserted through vocal cords under direct vision, positive ETCO2 and breath sounds checked- equal and bilateral Secured at: 22 cm Tube secured with: Tape Dental Injury: Teeth and Oropharynx as per pre-operative assessment

## 2022-07-08 NOTE — H&P (Signed)
REFERRING PHYSICIAN: [facility]*  PROVIDER: Landry Corporal, MD  MRN: [id number] DOB: Sep 23, 1996 Subjective   Chief Complaint: New Consultation (gallbladder)   History of Present Illness: Sandra Cross is a 26 y.o. female who is seen today as an office consultation for evaluation of New Consultation (gallbladder) .   We are asked to see the patient in consultation by Dr. Laray Anger to evaluate her for gallstones. The patient is a 26 year old white female who has been experiencing epigastric pain since the birth of her child about 3 months ago. The pain has been severe at times. The pain seems to radiate into her back. She has also had significant nausea and vomiting associated with it. She was evaluated with ultrasound and found to have stones in the gallbladder but no gallbladder wall thickening or ductal dilatation. Her liver functions were normal. She is otherwise in good health. She does vape.  Review of Systems: A complete review of systems was obtained from the patient. I have reviewed this information and discussed as appropriate with the patient. See HPI as well for other ROS.  ROS   Medical History: Past Medical History:  Diagnosis Date  Anxiety   Patient Active Problem List  Diagnosis  Calculus of gallbladder without cholecystitis without obstruction   History reviewed. No pertinent surgical history.   Allergies  Allergen Reactions  Effexor [Venlafaxine] Nausea   Current Outpatient Medications on File Prior to Visit  Medication Sig Dispense Refill  acetaminophen (TYLENOL) 500 MG tablet Take 500 mg by mouth every 6 (six) hours as needed  sulfamethoxazole-trimethoprim (BACTRIM DS) 800-160 mg tablet Take 1 tablet by mouth 2 (two) times daily   No current facility-administered medications on file prior to visit.   Family History  Problem Relation Age of Onset  Skin cancer Father    Social History   Tobacco Use  Smoking Status Former   Types: Cigarettes  Quit date: 2016  Years since quitting: 7.7  Smokeless Tobacco Never    Social History   Socioeconomic History  Marital status: Single  Tobacco Use  Smoking status: Former  Types: Cigarettes  Quit date: 2016  Years since quitting: 7.7  Smokeless tobacco: Never  Vaping Use  Vaping Use: Some days  Substance and Sexual Activity  Alcohol use: Not Currently  Drug use: Never   Objective:   Vitals:   BP: 100/70  Pulse: 106  Temp: 36.8 C (98.2 F)  SpO2: 98%  Weight: 74.4 kg (164 lb)   There is no height or weight on file to calculate BMI.  Physical Exam Vitals reviewed.  Constitutional:  General: She is not in acute distress. Appearance: Normal appearance.  HENT:  Head: Normocephalic and atraumatic.  Right Ear: External ear normal.  Left Ear: External ear normal.  Nose: Nose normal.  Mouth/Throat:  Mouth: Mucous membranes are moist.  Pharynx: Oropharynx is clear.  Eyes:  General: No scleral icterus. Extraocular Movements: Extraocular movements intact.  Conjunctiva/sclera: Conjunctivae normal.  Pupils: Pupils are equal, round, and reactive to light.  Cardiovascular:  Rate and Rhythm: Normal rate and regular rhythm.  Pulses: Normal pulses.  Heart sounds: Normal heart sounds.  Pulmonary:  Effort: Pulmonary effort is normal. No respiratory distress.  Breath sounds: Normal breath sounds.  Abdominal:  General: Bowel sounds are normal.  Palpations: Abdomen is soft. There is no mass.  Tenderness: There is no abdominal tenderness.  Musculoskeletal:  General: No swelling, tenderness or deformity. Normal range of motion.  Cervical back: Normal range  of motion and neck supple.  Skin: General: Skin is warm and dry.  Coloration: Skin is not jaundiced.  Neurological:  General: No focal deficit present.  Mental Status: She is alert and oriented to person, place, and time.  Psychiatric:  Mood and Affect: Mood normal.  Behavior: Behavior normal.     Labs, Imaging and Diagnostic Testing:  Assessment and Plan:   Diagnoses and all orders for this visit:  Calculus of gallbladder without cholecystitis without obstruction - [facility] Case Posting Request; Future    The patient appears to have symptomatic gallstones. Because of the risk of further painful episodes and possible pancreatitis I feel she would benefit from having her gallbladder removed. She would also like to have this done. I have discussed with her in detail the risks and benefits of the operation as well as some of the technical aspects including the risk of common bile duct injury and she understands and wishes to proceed. We will proceed with surgery scheduling.

## 2022-07-08 NOTE — Anesthesia Postprocedure Evaluation (Signed)
Anesthesia Post Note  Patient: Sandra Cross  Procedure(s) Performed: LAPAROSCOPIC CHOLECYSTECTOMY WITH INTRAOPERATIVE CHOLANGIOGRAM (Abdomen)     Patient location during evaluation: PACU Anesthesia Type: General Level of consciousness: awake and alert Pain management: pain level controlled Vital Signs Assessment: post-procedure vital signs reviewed and stable Respiratory status: spontaneous breathing, nonlabored ventilation and respiratory function stable Cardiovascular status: blood pressure returned to baseline and stable Postop Assessment: no apparent nausea or vomiting Anesthetic complications: no   No notable events documented.  Last Vitals:  Vitals:   07/08/22 1000 07/08/22 1010  BP: 109/82 130/78  Pulse: 94 (!) 101  Resp: 20 20  Temp:  36.4 C  SpO2: 100% 100%    Last Pain:  Vitals:   07/08/22 1010  TempSrc: Oral  PainSc: 2                  Lynda Rainwater

## 2022-07-08 NOTE — Op Note (Signed)
07/08/2022  9:25 AM  PATIENT:  Sandra Cross  26 y.o. female  PRE-OPERATIVE DIAGNOSIS:  GALLSTONES  POST-OPERATIVE DIAGNOSIS:  GALLSTONES  PROCEDURE:  Procedure(s): LAPAROSCOPIC CHOLECYSTECTOMY WITH INTRAOPERATIVE CHOLANGIOGRAM (N/A)  SURGEON:  Surgeon(s) and Role:    * Jovita Kussmaul, MD - Primary  PHYSICIAN ASSISTANT:   ASSISTANTS: none   ANESTHESIA:   local and general  EBL:  minimal   BLOOD ADMINISTERED:none  DRAINS: none   LOCAL MEDICATIONS USED:  MARCAINE     SPECIMEN:  Source of Specimen:  gallbladder  DISPOSITION OF SPECIMEN:  PATHOLOGY  COUNTS:  YES  TOURNIQUET:  * No tourniquets in log *  DICTATION: .Dragon Dictation    Procedure: After informed consent was obtained the patient was brought to the operating room and placed in the supine position on the operating room table. After adequate induction of general anesthesia the patient's abdomen was prepped with ChloraPrep allowed to dry and draped in usual sterile manner. An appropriate timeout was performed. The area below the umbilicus was infiltrated with quarter percent  Marcaine. A small incision was made with a 15 blade knife. The incision was carried down through the subcutaneous tissue bluntly with a hemostat and [employer] retractors. The linea alba was identified. The linea alba was incised with a 15 blade knife and each side was grasped with Coker clamps. The preperitoneal space was then probed with a hemostat until the peritoneum was opened and access was gained to the abdominal cavity. A 0 Vicryl pursestring stitch was placed in the fascia surrounding the opening. A Hassan cannula was then placed through the opening and anchored in place with the previously placed Vicryl purse string stitch. The abdomen was insufflated with carbon dioxide without difficulty. A laparoscope was inserted through the [facility] cannula in the right upper quadrant was inspected. Next the epigastric region was infiltrated with %  Marcaine. A small incision was made with a 15 blade knife. A 5 mm port was placed bluntly through this incision into the abdominal cavity under direct vision. Next 2 sites were chosen laterally on the right side of the abdomen for placement of 5 mm ports. Each of these areas was infiltrated with quarter percent Marcaine. Small stab incisions were made with a 15 blade knife. 5 mm ports were then placed bluntly through these incisions into the abdominal cavity under direct vision without difficulty. A blunt grasper was placed through the lateralmost 5 mm port and used to grasp the dome of the gallbladder and elevate it anteriorly and superiorly. Another blunt grasper was placed through the other 5 mm port and used to retract the body and neck of the gallbladder. A dissector was placed through the epigastric port and using the electrocautery the peritoneal reflection at the gallbladder neck was opened. Blunt dissection was then carried out in this area until the gallbladder neck-cystic duct junction was readily identified and a good window was created. A single clip was placed on the gallbladder neck. A small  ductotomy was made just below the clip with laparoscopic scissors. A 14-gauge Angiocath was then placed through the anterior abdominal wall under direct vision. A Reddick cholangiogram catheter was then placed through the Angiocath and flushed. The catheter was then placed in the cystic duct and anchored in place with a clip. A cholangiogram was obtained that showed no filling defects, good emptying into the duodenum, and an adequate length on the cystic duct. The anchoring clip and catheters were then removed from the patient. 3 clips  were placed proximally on the cystic duct and the duct was divided between the 2 sets of clips. Posterior to this the cystic artery was identified and again dissected bluntly in a circumferential manner until a good window  was created. 2 clips were placed proximally and one  distally on the artery and the artery was divided between the 2 sets of clips. Next a laparoscopic hook cautery device was used to separate the gallbladder from the liver bed. Prior to completely detaching the gallbladder from the liver bed the liver bed was inspected and several small bleeding points were coagulated with the electrocautery until the area was completely hemostatic. The gallbladder was then detached the rest of it from the liver bed without difficulty. A laparoscopic bag was inserted through the hassan port. The laparoscope was moved to the epigastric port. The gallbladder was placed within the bag and the bag was sealed.  The bag with the gallbladder was then removed with the [facility] cannula through the infraumbilical port without difficulty. The fascial defect was then closed with the previously placed Vicryl pursestring stitch as well as with another figure-of-eight 0 Vicryl stitch. The liver bed was inspected again and found to be hemostatic. The abdomen was irrigated with copious amounts of saline until the effluent was clear. The ports were then removed under direct vision without difficulty and were found to be hemostatic. The gas was allowed to escape. No other abnormalities were noted on general inspection of the abdomen. The skin incisions were all closed with interrupted 4-0 Monocryl subcuticular stitches. Dermabond dressings were applied. The patient tolerated the procedure well. At the end of the case all needle sponge and instrument counts were correct. The patient was then awakened and taken to recovery in stable condition   PLAN OF CARE: Discharge to home after PACU  PATIENT DISPOSITION:  PACU - hemodynamically stable.   Delay start of Pharmacological VTE agent (>24hrs) due to surgical blood loss or risk of bleeding: not applicable

## 2022-07-09 ENCOUNTER — Encounter (HOSPITAL_COMMUNITY): Payer: Self-pay | Admitting: General Surgery

## 2022-07-09 LAB — SURGICAL PATHOLOGY

## 2022-07-26 NOTE — Progress Notes (Unsigned)
jNEXPLANON INSERTION PRE-OP DIAGNOSIS: desired long-term, reversible contraception  POST-OP DIAGNOSIS: Same  PROCEDURE: Nexplanon  placement Performing Provider: Samuel Bouche, FNP  Risks and benefits reviewed with the patient. Informed consent obtained, patient opts to proceed today.    PROCEDURE:  Site (check): left arm  Lot # [id number] Sterile Preparation: Chlorhexidine   Insertion site was selected 8 - 10 cm from medial epicondyle and marked along with guiding site using sterile marker  Procedure area was prepped and draped in a sterile fashion. 3 mL of 1% lidocaine without epinephrine used for subcutaneous anesthesia. Anesthesia confirmed.  Nexplanon  trocar was inserted subcutaneously and then Nexplanon  capsule delivered subcutaneously Trocar was removed from the insertion site. Nexplanon  capsule was palpated by provider and patient to assure satisfactory placement. Estimated blood loss <1 mL Dressings applied: Steri-Strip and small pressure bandage Followup: The patient tolerated the procedure well without complications.  Standard post-procedure care is explained and return precautions are given.  ___________________________________________ Clearnce Sorrel, DNP, APRN, FNP-BC Primary Care and [place]

## 2022-07-27 ENCOUNTER — Encounter: Payer: Self-pay | Admitting: Medical-Surgical

## 2022-07-27 ENCOUNTER — Ambulatory Visit (INDEPENDENT_AMBULATORY_CARE_PROVIDER_SITE_OTHER): Payer: 59 | Admitting: Medical-Surgical

## 2022-07-27 VITALS — BP 105/68 | HR 104 | Resp 20 | Ht 63.0 in | Wt 163.9 lb

## 2022-07-27 DIAGNOSIS — Z30017 Encounter for initial prescription of implantable subdermal contraceptive: Secondary | ICD-10-CM

## 2022-07-27 LAB — POCT URINE PREGNANCY: Preg Test, Ur: NEGATIVE

## 2022-07-27 NOTE — Patient Instructions (Signed)
Nexplanon Instructions After Insertion  Keep bandage clean and dry for 24 hours  May use ice/Tylenol/Ibuprofen for soreness or pain  If you develop fever, drainage or increased warmth from incision site-contact office immediately  Etonogestrel Implant What is this medication? ETONOGESTREL (et oh noe JES trel) prevents ovulation and pregnancy. It belongs to a group of medications called contraceptives. This medication is a progestin hormone. This medicine may be used for other purposes; ask your health care provider or pharmacist if you have questions. COMMON BRAND NAME(S): Implanon, Nexplanon What should I tell my care team before I take this medication? They need to know if you have any of these conditions: Abnormal vaginal bleeding Blood clots Blood vessel disease Breast, cervical, endometrial, ovarian, liver, or uterine cancer Diabetes Gallbladder disease Heart disease or recent heart attack High blood pressure High cholesterol or triglycerides Kidney disease Liver disease Migraine headaches Seizures Stroke Tobacco use An unusual or allergic reaction to etonogestrel, other medications, foods, dyes, or preservatives Pregnant or trying to get pregnant Breastfeeding How should I use this medication? This device is inserted just under the skin on the inner side of your upper arm by your care team. Talk to your care team about the use of this medication in children. Special care may be needed. Overdosage: If you think you have taken too much of this medicine contact a poison control center or emergency room at once. NOTE: This medicine is only for you. Do not share this medicine with others. What if I miss a dose? This does not apply. What may interact with this medication? Do not take this medication with any of the following: Amprenavir Fosamprenavir This medication may also interact with the  following: Acitretin Aprepitant Armodafinil Bexarotene Bosentan Carbamazepine Certain antivirals for HIV or hepatitis Certain medications for fungal infections, such as fluconazole, ketoconazole, itraconazole, or voriconazole Cyclosporine Felbamate Griseofulvin Lamotrigine Modafinil Oxcarbazepine Phenobarbital Phenytoin Primidone Rifabutin Rifampin Rifapentine St. John's wort Topiramate This list may not describe all possible interactions. Give your health care provider a list of all the medicines, herbs, non-prescription drugs, or dietary supplements you use. Also tell them if you smoke, drink alcohol, or use illegal drugs. Some items may interact with your medicine. What should I watch for while using this medication? Visit your care team for regular checks on your progress. Using this medication does not protect you or your partner against HIV or other sexually transmitted infections (STIs). You should be able to feel the implant by pressing your fingertips over the skin where it was inserted. Contact your care team if you cannot feel the implant, and use a non-hormonal birth control method (such as condoms) until your care team confirms that the implant is in place. Contact your care team if you think that the implant may have broken or become bent while in your arm. You will receive a user card from your care team after the implant is inserted. The card is a record of the location of the implant in your upper arm and when it should be removed. Keep this card with your health records. What side effects may I notice from receiving this medication? Side effects that you should report to your care team as soon as possible: Allergic reactions--skin rash, itching, hives, swelling of the face, lips, tongue, or throat Blood clot--pain, swelling, or warmth in the leg, shortness of breath, chest pain Gallbladder problems--severe stomach pain, nausea, vomiting, fever Increase in blood  pressure Liver injury--right upper belly pain, loss of appetite, nausea,   light-colored stool, dark yellow or brown urine, yellowing skin or eyes, unusual weakness or fatigue New or worsening migraines or headaches Pain, redness, or irritation at injection site Stroke--sudden numbness or weakness of the face, arm, or leg, trouble speaking, confusion, trouble walking, loss of balance or coordination, dizziness, severe headache, change in vision Unusual vaginal discharge, itching, or odor Worsening mood, feelings of depression Side effects that usually do not require medical attention (report to your care team if they continue or are bothersome): Breast pain or tenderness Dark patches of skin on the face or other sun-exposed areas Irregular menstrual cycles or spotting Nausea Weight gain This list may not describe all possible side effects. Call your doctor for medical advice about side effects. You may report side effects to FDA at 1-800-FDA-1088. Where should I keep my medication? This medication is given in a hospital or clinic and will not be stored at home. NOTE: This sheet is a summary. It may not cover all possible information. If you have questions about this medicine, talk to your doctor, pharmacist, or health care provider.  2023 Elsevier/Gold Standard (2022-04-21 00:00:00)

## 2022-09-11 ENCOUNTER — Ambulatory Visit (INDEPENDENT_AMBULATORY_CARE_PROVIDER_SITE_OTHER): Payer: 59 | Admitting: Medical-Surgical

## 2022-09-11 ENCOUNTER — Encounter: Payer: Self-pay | Admitting: Medical-Surgical

## 2022-09-11 VITALS — BP 119/73 | HR 83 | Ht 63.0 in | Wt 168.0 lb

## 2022-09-11 DIAGNOSIS — Z3046 Encounter for surveillance of implantable subdermal contraceptive: Secondary | ICD-10-CM | POA: Diagnosis not present

## 2022-09-11 NOTE — Progress Notes (Signed)
   Established Patient Office Visit  Subjective   Patient ID: Sandra Cross, female   DOB: 1995-10-31 Age: 26 y.o. MRN: [id number]   Chief Complaint  Patient presents with   Nexplanon follow up   HPI Pleasant 26 year old female presenting today to follow-up after having her Nexplanon placed on 07/27/2022.  Reports that she healed well after the placement with minimal bruising.  The area was not excessively sore and she had no issues with infection at the insertion site.  She does feel for the Nexplanon every day and is able to palpate it easily.  Since it was placed, she has had irregular menses but notes that the flow is light and it is more annoying than problematic.  Also notes that she had some mood swings before but it seems to have worsened just a bit since the Nexplanon was placed.  She gets more irritable than usual.  Notes that she is working out the irritability part of it.     Objective:    Vitals:   09/11/22 0915  BP: 119/73  Pulse: 83  Height: 5\' 3"  (1.6 m)  Weight: 168 lb (76.2 kg)  SpO2: 100%  BMI (Calculated): 29.77   Physical Exam Vitals and nursing note reviewed.  Constitutional:      General: She is not in acute distress.    Appearance: Normal appearance. She is not ill-appearing.  HENT:     Head: Normocephalic and atraumatic.  Cardiovascular:     Rate and Rhythm: Normal rate and regular rhythm.  Pulmonary:     Effort: Pulmonary effort is normal. No respiratory distress.  Skin:    General: Skin is warm and dry.  Neurological:     Mental Status: She is alert and oriented to person, place, and time.  Psychiatric:        Mood and Affect: Mood normal.        Behavior: Behavior normal.        Thought Content: Thought content normal.        Judgment: Judgment normal.      No results found for this or any previous visit (from the past 24 hour(s)).     The ASCVD Risk score (Arnett DK, et al., 2019) failed to calculate for the following reasons:   The  2019 ASCVD risk score is only valid for ages 74 to 79   Assessment & Plan:   1. Encounter for surveillance of Nexplanon subdermal contraceptive Nexplanon palpable in the left upper arm at the site of insertion, no migration.  No evidence of infection at the site.  Discussed irregular bleeding.  Advised that her bleeding pattern at the 65-month period is what she can expect and if this continues to be irregular and bothersome, she should return for discussion of other birth control options.  Plan to replace Nexplanon in 3 years.  Return if symptoms worsen or fail to improve.  ___________________________________________ 8-month, DNP, APRN, FNP-BC Primary Care and Sports Medicine [facility] [place]

## 2022-10-22 ENCOUNTER — Ambulatory Visit (INDEPENDENT_AMBULATORY_CARE_PROVIDER_SITE_OTHER): Payer: 59 | Admitting: Family Medicine

## 2022-10-22 ENCOUNTER — Encounter: Payer: Self-pay | Admitting: Family Medicine

## 2022-10-22 VITALS — BP 118/83 | HR 70 | Temp 99.3°F | Ht 63.0 in | Wt 168.0 lb

## 2022-10-22 DIAGNOSIS — J101 Influenza due to other identified influenza virus with other respiratory manifestations: Secondary | ICD-10-CM | POA: Diagnosis not present

## 2022-10-22 DIAGNOSIS — R509 Fever, unspecified: Secondary | ICD-10-CM | POA: Diagnosis not present

## 2022-10-22 DIAGNOSIS — R112 Nausea with vomiting, unspecified: Secondary | ICD-10-CM | POA: Diagnosis not present

## 2022-10-22 LAB — POCT INFLUENZA A/B
Influenza A, POC: NEGATIVE
Influenza B, POC: POSITIVE — AB

## 2022-10-22 LAB — POC COVID19 BINAXNOW: SARS Coronavirus 2 Ag: NEGATIVE

## 2022-10-22 MED ORDER — ONDANSETRON HCL 4 MG PO TABS: 4.0000 mg | ORAL_TABLET | Freq: Three times a day (TID) | ORAL | 0 refills | Status: DC | PRN

## 2022-10-22 MED ORDER — OSELTAMIVIR PHOSPHATE 75 MG PO CAPS: 75.0000 mg | ORAL_CAPSULE | Freq: Two times a day (BID) | ORAL | 0 refills | Status: DC

## 2022-10-22 NOTE — Progress Notes (Signed)
   Acute Office Visit  Subjective:     Patient ID: Sandra Cross, female    DOB: 03/06/1996, 27 y.o.   MRN: [id number]  Chief Complaint  Patient presents with   Emesis   Fever    HPI Patient is in today for nausea vomiting and diarrhea that started this morning.  She says she has been around 3 family members that have had more respiratory type symptoms.  She says she has had a cough for couple of days but no sore throat.took tylenol. + fever.   ROS      Objective:    BP 118/83   Pulse 70   Temp 99.3 F (37.4 C)   Ht 5\' 3"  (1.6 m)   Wt 168 lb (76.2 kg)   SpO2 97%   BMI 29.76 kg/m    Physical Exam Constitutional:      Appearance: She is well-developed.  HENT:     Head: Normocephalic and atraumatic.     Right Ear: External ear normal.     Left Ear: External ear normal.     Nose: Nose normal.  Eyes:     Conjunctiva/sclera: Conjunctivae normal.     Pupils: Pupils are equal, round, and reactive to light.  Neck:     Thyroid: No thyromegaly.  Cardiovascular:     Rate and Rhythm: Normal rate and regular rhythm.     Heart sounds: Normal heart sounds.  Pulmonary:     Effort: Pulmonary effort is normal.     Breath sounds: Normal breath sounds. No wheezing.  Musculoskeletal:     Cervical back: Neck supple.  Lymphadenopathy:     Cervical: No cervical adenopathy.  Skin:    General: Skin is warm and dry.  Neurological:     Mental Status: She is alert and oriented to person, place, and time.     Results for orders placed or performed in visit on 10/22/22  POC COVID-19  Result Value Ref Range   SARS Coronavirus 2 Ag Negative Negative  POCT Influenza A/B  Result Value Ref Range   Influenza A, POC Negative Negative   Influenza B, POC Positive (A) Negative        Assessment & Plan:   Problem List Items Addressed This Visit   None Visit Diagnoses     Fever, unspecified fever cause    -  Primary   Relevant Orders   POC COVID-19 (Completed)   POCT  Influenza A/B (Completed)   Influenza B       Relevant Medications   oseltamivir (TAMIFLU) 75 MG capsule   Nausea and vomiting, unspecified vomiting type          Flu B -will treat with Tamiflu.  Call if not better in 1 week.  Okay to continue symptomatic care.  Also send over some Zofran for the nausea and vomiting.  Stay well-hydrated.  She is not currently nursing.  Meds ordered this encounter  Medications   oseltamivir (TAMIFLU) 75 MG capsule    Sig: Take 1 capsule (75 mg total) by mouth 2 (two) times daily.    Dispense:  10 capsule    Refill:  0   ondansetron (ZOFRAN) 4 MG tablet    Sig: Take 1 tablet (4 mg total) by mouth every 8 (eight) hours as needed for nausea or vomiting.    Dispense:  12 tablet    Refill:  0    No follow-ups on file.  Beatrice Lecher, MD

## 2023-01-19 ENCOUNTER — Encounter: Payer: Self-pay | Admitting: Family Medicine

## 2023-01-19 ENCOUNTER — Ambulatory Visit (INDEPENDENT_AMBULATORY_CARE_PROVIDER_SITE_OTHER): Payer: 59 | Admitting: Family Medicine

## 2023-01-19 VITALS — BP 118/62 | HR 121 | Temp 99.0°F | Ht 63.0 in | Wt 174.0 lb

## 2023-01-19 DIAGNOSIS — J029 Acute pharyngitis, unspecified: Secondary | ICD-10-CM | POA: Diagnosis not present

## 2023-01-19 LAB — POCT RAPID STREP A (OFFICE): Rapid Strep A Screen: NEGATIVE

## 2023-01-19 NOTE — Progress Notes (Signed)
   Acute Office Visit  Subjective:     Patient ID: Sandra Cross, female    DOB: 07-02-1996, 27 y.o.   MRN: [id number]  Chief Complaint  Patient presents with   swollen tonsils    HPI Patient is in today for sore throat and enlarged tonsils that she started noticing this morning.  She has had a headache for about 3 days and had a decreased appetite.  She did have a little increased nasal congestion last night but no cough.  No fever or chills.  ROS      Objective:    BP 118/62   Pulse (!) 121   Temp 99 F (37.2 C)   Ht  (1.6 m)   Wt 174 lb (78.9 kg)   SpO2 99%   BMI 30.82 kg/m    Physical Exam Constitutional:      Appearance: She is well-developed.  HENT:     Head: Normocephalic and atraumatic.     Right Ear: External ear normal.     Left Ear: External ear normal.     Nose: Nose normal.     Mouth/Throat:     Pharynx: Oropharyngeal exudate and posterior oropharyngeal erythema present.     Comments: Bilat tonsillar swelling Eyes:     Conjunctiva/sclera: Conjunctivae normal.     Pupils: Pupils are equal, round, and reactive to light.  Neck:     Thyroid: No thyromegaly.  Cardiovascular:     Rate and Rhythm: Normal rate and regular rhythm.     Heart sounds: Normal heart sounds.  Pulmonary:     Effort: Pulmonary effort is normal.     Breath sounds: Normal breath sounds. No wheezing.  Musculoskeletal:     Cervical back: Neck supple. Tenderness present.  Lymphadenopathy:     Cervical: Cervical adenopathy present.  Skin:    General: Skin is warm and dry.  Neurological:     Mental Status: She is alert and oriented to person, place, and time.     Results for orders placed or performed in visit on 01/19/23  POCT rapid strep A  Result Value Ref Range   Rapid Strep A Screen Negative Negative        Assessment & Plan:   Problem List Items Addressed This Visit   None Visit Diagnoses     Sore throat    -  Primary   Relevant Orders   POCT rapid  strep A (Completed)   Culture, Group A Strep      Rapid strep was negative but will send for culture for confirmation in the short-term recommend salt water gargles ibuprofen or Aleve.  No orders of the defined types were placed in this encounter.   No follow-ups on file.  Nani Gasser, MD

## 2023-01-21 LAB — CULTURE, GROUP A STREP
MICRO NUMBER:: 14862136
SPECIMEN QUALITY:: ADEQUATE

## 2023-01-21 NOTE — Progress Notes (Signed)
Hi Sandra Cross, great news.  Final strep culture is negative.  So it is most likely viral.  Hopefully you are starting to feel little better I know it can take about a week for this to go away.

## 2023-06-29 ENCOUNTER — Ambulatory Visit (INDEPENDENT_AMBULATORY_CARE_PROVIDER_SITE_OTHER): Payer: 59 | Admitting: Family Medicine

## 2023-06-29 VITALS — BP 107/57 | HR 81 | Ht 63.0 in | Wt 167.0 lb

## 2023-06-29 DIAGNOSIS — G43819 Other migraine, intractable, without status migrainosus: Secondary | ICD-10-CM

## 2023-06-29 DIAGNOSIS — M79672 Pain in left foot: Secondary | ICD-10-CM

## 2023-06-29 DIAGNOSIS — M79671 Pain in right foot: Secondary | ICD-10-CM

## 2023-06-29 DIAGNOSIS — R200 Anesthesia of skin: Secondary | ICD-10-CM

## 2023-06-29 DIAGNOSIS — R202 Paresthesia of skin: Secondary | ICD-10-CM

## 2023-06-29 MED ORDER — PROPRANOLOL HCL 20 MG PO TABS: 20.0000 mg | ORAL_TABLET | Freq: Two times a day (BID) | ORAL | 1 refills | Status: DC

## 2023-06-29 MED ORDER — ELETRIPTAN HYDROBROMIDE 20 MG PO TABS: 20.0000 mg | ORAL_TABLET | ORAL | 3 refills | Status: DC | PRN

## 2023-06-29 NOTE — Progress Notes (Signed)
Established Patient Office Visit  Subjective   Patient ID: Sandra Cross, female    DOB: May 31, 1996  Age: 27 y.o. MRN: [id number]  Chief Complaint  Patient presents with   Foot Pain    X 1 month she states that it feels like pens and needles in her feet. She has tried soaking her feet and compression socks, she also c/o ankle pain.    Migraine    She reports that she had a migraine for 1 week 2 weeks ago and prior to that she had one for 3 days. She took Tylenol for the pain. She was previously on Relpax 20 mg and stated that this really helped her.     HPI F/U Migraines -reports that her migraines have really ramped up recently.  No specific triggers.  But it has been more frequent especially over the last month.  She is mostly just using Tylenol for pain.  Also feet go numb and tingle esp at rest.  She says when she walks they feel ike pins and needles.  No swelling or redness.  Ankle are painful in the AM when she gets up.       ROS    Objective:     BP (!) 107/57   Pulse 81   Ht 5\' 3"  (1.6 m)   Wt 167 lb (75.8 kg)   SpO2 100%   BMI 29.58 kg/m    Physical Exam Vitals reviewed.  Constitutional:      Appearance: Normal appearance.  HENT:     Head: Normocephalic.  Pulmonary:     Effort: Pulmonary effort is normal.  Musculoskeletal:     Comments: Feet with no erythema deformity or swelling.  Dorsal pedal pulse and posterior tibial pulse 2+.  Toes and ankles with normal range of motion.  Nontender over the metatarsals or the toes.  She does have a little discomfort with squeeze over the distal foot on the left foot.  Neurological:     Mental Status: She is alert and oriented to person, place, and time.  Psychiatric:        Mood and Affect: Mood normal.        Behavior: Behavior normal.      No results found for any visits on 06/29/23.    The ASCVD Risk score (Arnett DK, et al., 2019) failed to calculate for the following reasons:   The 2019 ASCVD risk  score is only valid for ages 102 to 5    Assessment & Plan:   Problem List Items Addressed This Visit       Cardiovascular and Mediastinum   Migraine - Primary    Discussed  starting prophylaxis.  Because she does have Nexplanon and will try to avoid Topamax we will start with a beta-blocker.  Also refilled Relpax which she had used a couple years ago she says her migraines did great during her pregnancy in her postpartum period but now they are back.  To avoid triggers.      Relevant Medications   eletriptan (RELPAX) 20 MG tablet   propranolol (INDERAL) 20 MG tablet   Other Relevant Orders   TSH   B12   Folate   Vitamin B1   Vitamin B6   Magnesium   CBC with Differential/Platelet   CMP14+EGFR   Other Visit Diagnoses     Pain in both feet       Relevant Orders   TSH   B12   Folate   Vitamin  B1   Vitamin B6   Magnesium   CBC with Differential/Platelet   CMP14+EGFR   Numbness and tingling of both feet       Relevant Orders   TSH   B12   Folate   Vitamin B1   Vitamin B6   Magnesium   CBC with Differential/Platelet   CMP14+EGFR      Numbness and tingling in both feet-evaluate for neuropathy versus mechanical issues she does wear steel toed boots to work but does had some good memory foam cushion in them.  Will start by checking labs to rule out deficiencies and thyroid disorder.  If everything is normal then consider podiatry referral for further workup.  Return in about 2 months (around 08/29/2023) for New start medication for Migraines.    Nani Gasser, MD

## 2023-06-29 NOTE — Assessment & Plan Note (Signed)
Discussed  starting prophylaxis.  Because she does have Nexplanon and will try to avoid Topamax we will start with a beta-blocker.  Also refilled Relpax which she had used a couple years ago she says her migraines did great during her pregnancy in her postpartum period but now they are back.  To avoid triggers.

## 2023-06-30 NOTE — Progress Notes (Signed)
Hi Sandra Cross, thyroid and vitamin B12 look good.  Folate is also normal.  Vitamin B1 and B6 are still pending.  Magnesium looks great.  Blood count is normal no sign of anemia.  Kidney and liver function are great.

## 2023-07-02 LAB — MAGNESIUM: Magnesium: 1.8 mg/dL (ref 1.6–2.3)

## 2023-07-02 LAB — CBC WITH DIFFERENTIAL/PLATELET
Basophils Absolute: 0 10*3/uL (ref 0.0–0.2)
Basos: 0 %
EOS (ABSOLUTE): 0.1 10*3/uL (ref 0.0–0.4)
Eos: 1 %
Hematocrit: 41.2 % (ref 34.0–46.6)
Hemoglobin: 13.1 g/dL (ref 11.1–15.9)
Immature Grans (Abs): 0 10*3/uL (ref 0.0–0.1)
Immature Granulocytes: 0 %
Lymphocytes Absolute: 2.9 10*3/uL (ref 0.7–3.1)
Lymphs: 30 %
MCH: 27.5 pg (ref 26.6–33.0)
MCHC: 31.8 g/dL (ref 31.5–35.7)
MCV: 87 fL (ref 79–97)
Monocytes Absolute: 0.6 10*3/uL (ref 0.1–0.9)
Monocytes: 7 %
Neutrophils Absolute: 5.8 10*3/uL (ref 1.4–7.0)
Neutrophils: 62 %
Platelets: 388 10*3/uL (ref 150–450)
RBC: 4.76 x10E6/uL (ref 3.77–5.28)
RDW: 13.7 % (ref 11.7–15.4)
WBC: 9.5 10*3/uL (ref 3.4–10.8)

## 2023-07-02 LAB — CMP14+EGFR
ALT: 10 [IU]/L (ref 0–32)
AST: 13 [IU]/L (ref 0–40)
Albumin: 4.5 g/dL (ref 4.0–5.0)
Alkaline Phosphatase: 76 [IU]/L (ref 44–121)
BUN/Creatinine Ratio: 14 (ref 9–23)
BUN: 10 mg/dL (ref 6–20)
Bilirubin Total: 0.3 mg/dL (ref 0.0–1.2)
CO2: 21 mmol/L (ref 20–29)
Calcium: 9.1 mg/dL (ref 8.7–10.2)
Chloride: 102 mmol/L (ref 96–106)
Creatinine, Ser: 0.7 mg/dL (ref 0.57–1.00)
Globulin, Total: 2.4 g/dL (ref 1.5–4.5)
Glucose: 80 mg/dL (ref 70–99)
Potassium: 4.2 mmol/L (ref 3.5–5.2)
Sodium: 139 mmol/L (ref 134–144)
Total Protein: 6.9 g/dL (ref 6.0–8.5)
eGFR: 122 mL/min/{1.73_m2} (ref >59)

## 2023-07-02 LAB — FOLATE: Folate: 13.4 ng/mL (ref >3.0)

## 2023-07-02 LAB — TSH: TSH: 1.23 u[IU]/mL (ref 0.450–4.500)

## 2023-07-02 LAB — VITAMIN B12: Vitamin B-12: 381 pg/mL (ref 232–1245)

## 2023-07-02 LAB — VITAMIN B6: Vitamin B6: 4.9 ug/L (ref 3.4–65.2)

## 2023-07-02 LAB — VITAMIN B1: Thiamine: 130.4 nmol/L (ref 66.5–200.0)

## 2023-07-06 NOTE — Progress Notes (Signed)
Vitamin B1 looks great.  Your vitamin B6 is technically normal but it is definitely on the low end of normal.  So I do encourage you to pick up some over-the-counter vitamin B6.  Ashby [employer] made makes 1 that is the [employer] with a yellow label which is usually pretty easy to find and most stores.  It is 100 mg and you can take 1 daily.  We can plan to recheck your level in about 3 months.  Also vitamin B6 is found in things like fish chickpeas bananas and poultry and fortified cereals and that can be a great source to get it through your diet as well.

## 2023-08-29 ENCOUNTER — Other Ambulatory Visit: Payer: Self-pay | Admitting: Family Medicine

## 2023-08-29 DIAGNOSIS — G43819 Other migraine, intractable, without status migrainosus: Secondary | ICD-10-CM

## 2023-09-13 ENCOUNTER — Ambulatory Visit: Payer: 59 | Admitting: Family Medicine

## 2023-09-13 VITALS — BP 99/52 | HR 81 | Ht 62.5 in | Wt 174.4 lb

## 2023-09-13 DIAGNOSIS — G43819 Other migraine, intractable, without status migrainosus: Secondary | ICD-10-CM

## 2023-09-13 DIAGNOSIS — F321 Major depressive disorder, single episode, moderate: Secondary | ICD-10-CM

## 2023-09-13 MED ORDER — TOPIRAMATE 25 MG PO TABS: ORAL_TABLET | ORAL | 0 refills | Status: DC

## 2023-09-13 MED ORDER — TOPIRAMATE 50 MG PO TABS: 50.0000 mg | ORAL_TABLET | Freq: Two times a day (BID) | ORAL | 0 refills | Status: DC

## 2023-09-13 NOTE — Progress Notes (Signed)
Established Patient Office Visit  Subjective  Patient ID: Sandra Cross, female    DOB: 11/06/1995  Age: 27 y.o. MRN: [id number]  No chief complaint on file.   HPI  F/U Migraines. Says the propranolol  worked for about 2 months.  Then it seemed to quit working.  But she says the Relpax does seem to work when she uses it.  She is getting about 2-3 migraines per week and in the last 2 months she is gone through 19 tabs of Relpax.  She does avoid caffeine.  She has been struggling a little bit more with her mood and feeling down.  She says she goes to work and then comes home and spends time with her baby who is now 1-1/2 and then just wants to go to bed she does not want to do a lot outside of the home or work.  But she is not interested in medications at this point in time she is currently living with her mother.    ROS    Objective:     BP (!) 99/52 (BP Location: Left Arm, Patient Position: Sitting, Cuff Size: Normal)   Pulse 81   Ht 5' 2.5" (1.588 m)   Wt 174 lb 7 oz (79.1 kg)   SpO2 100%   BMI 31.40 kg/m    Physical Exam Vitals and nursing note reviewed.  Constitutional:      Appearance: Normal appearance.  HENT:     Head: Normocephalic and atraumatic.  Eyes:     Conjunctiva/sclera: Conjunctivae normal.  Cardiovascular:     Rate and Rhythm: Normal rate and regular rhythm.  Pulmonary:     Effort: Pulmonary effort is normal.     Breath sounds: Normal breath sounds.  Skin:    General: Skin is warm and dry.  Neurological:     Mental Status: She is alert.  Psychiatric:        Mood and Affect: Mood normal.      No results found for any visits on 09/13/23.    The ASCVD Risk score (Arnett DK, et al., 2019) failed to calculate for the following reasons:   The 2019 ASCVD risk score is only valid for ages 40 to 75    Assessment & Plan:   Problem List Items Addressed This Visit       Cardiovascular and Mediastinum   Migraine - Primary   We discussed  options I do not think that the propranolol is working well and her blood pressures are definitely on the lower side.  So I do not feel I can really have room to go up.  We discussed options we will switch to Topamax.  Follow-up in 2 months to make sure that she is tolerating it well and adjust the dose if needed.  Continue with Relpax which does seem to work really well.  Encouraged her to continue to work on diet and exercise and getting good sleep quality for her migraines.      Relevant Medications   topiramate (TOPAMAX) 25 MG tablet   topiramate (TOPAMAX) 50 MG tablet (Start on 10/10/2023)     Other   Moderate single current episode of major depressive disorder (HCC)   Not currently on medications and not interested in starting medicine at this point but did encourage her to reach out if at any point she is open to that or would like a referral for therapy/counseling.       Return in about 2 months (around  11/14/2023) for Migraines .    Nani Gasser, MD

## 2023-09-13 NOTE — Assessment & Plan Note (Signed)
We discussed options I do not think that the propranolol is working well and her blood pressures are definitely on the lower side.  So I do not feel I can really have room to go up.  We discussed options we will switch to Topamax.  Follow-up in 2 months to make sure that she is tolerating it well and adjust the dose if needed.  Continue with Relpax which does seem to work really well.  Encouraged her to continue to work on diet and exercise and getting good sleep quality for her migraines.

## 2023-09-13 NOTE — Patient Instructions (Signed)
I sent over 2 prescriptions for the Topamax.  The one today is the starter dose we will start with once a day for several days and then you will go up to twice a day.  The second prescription is for January we will increase to 50 mg twice a day.  I will see you back in 2 months to make sure that it is working and helping and you are tolerating it well.

## 2023-09-13 NOTE — Assessment & Plan Note (Signed)
Not currently on medications and not interested in starting medicine at this point but did encourage her to reach out if at any point she is open to that or would like a referral for therapy/counseling.

## 2023-10-01 ENCOUNTER — Encounter: Payer: Self-pay | Admitting: Emergency Medicine

## 2023-10-01 ENCOUNTER — Ambulatory Visit
Admission: EM | Admit: 2023-10-01 | Discharge: 2023-10-01 | Disposition: A | Discharge status: Home / Self Care | Payer: 59 | Attending: Emergency Medicine | Admitting: Emergency Medicine

## 2023-10-01 DIAGNOSIS — J00 Acute nasopharyngitis [common cold]: Secondary | ICD-10-CM | POA: Diagnosis not present

## 2023-10-01 LAB — POC COVID19/FLU A&B COMBO
Covid Antigen, POC: NEGATIVE
Influenza A Antigen, POC: NEGATIVE
Influenza B Antigen, POC: NEGATIVE

## 2023-10-01 MED ORDER — IPRATROPIUM BROMIDE 0.06 % NA SOLN: 2.0000 | Freq: Three times a day (TID) | NASAL | 1 refills | Status: DC

## 2023-10-01 MED ORDER — PROMETHAZINE-DM 6.25-15 MG/5ML PO SYRP: 5.0000 mL | ORAL_SOLUTION | Freq: Every evening | ORAL | 0 refills | Status: DC | PRN

## 2023-10-01 NOTE — ED Provider Notes (Signed)
 [facility]    CSN: [id number] Arrival date & time: 10/01/23  0818    HISTORY   Chief Complaint  Patient presents with   Fever   Cough   Nasal Congestion   HPI Sandra Cross is a pleasant, 28 y.o. female who presents to urgent care today. Pt c/o chills, subjective fever, nasal congestion, clear rhinorrhea and non-productive cough x 3 days. States she took tylenol  and dayquil around 4am today, states has also been using Sinex nasal spray. Pt has hx asthma. VS are normal on arrival today.  Patient denies body aches, nausea, vomiting, diarrhea, sore throat, known sick contacts   The history is provided by the patient.   History reviewed. No pertinent past medical history. Patient Active Problem List   Diagnosis Date Noted   Calculus of gallbladder without cholecystitis without obstruction 06/10/2022   RUQ pain 06/04/2022   Low grade squamous intraepithelial lesion on cytologic smear of cervix (LGSIL) 10/29/2021   History of drug use 08/12/2021   BMI 31.0-31.9,adult 08/07/2021   Migraine 08/06/2020   Moderate single current episode of major depressive disorder (HCC) 08/03/2016   GAD (generalized anxiety disorder) 08/03/2016   Past Surgical History:  Procedure Laterality Date   CHOLECYSTECTOMY N/A 07/08/2022   Procedure: LAPAROSCOPIC CHOLECYSTECTOMY WITH INTRAOPERATIVE CHOLANGIOGRAM;  Surgeon: Curvin Deward MOULD, MD;  Location: [facility];  Service: General;  Laterality: N/A;   NO PAST SURGERIES     OB History   No obstetric history on file.    Home Medications    Prior to Admission medications   Medication Sig Start Date End Date Taking? Authorizing Provider  albuterol (VENTOLIN HFA) 108 (90 Base) MCG/ACT inhaler Inhale 1-2 puffs into the lungs every 6 (six) hours as needed for wheezing or shortness of breath.    [provider]  eletriptan  (RELPAX ) 20 MG tablet Take 1 tablet (20 mg total) by mouth as needed for migraine or headache. May repeat in 2 hours if  headache persists or recurs. 06/29/23   Alvan Dorothyann BIRCH, MD  etonogestrel  (NEXPLANON ) 68 MG IMPL implant 1 each by Subdermal route once.    [provider]  topiramate  (TOPAMAX ) 25 MG tablet Take 1 tablet (25 mg total) by mouth daily for 4 days, THEN 1 tablet (25 mg total) 2 (two) times daily for 10 days, THEN 1 tablet (25 mg total) 2 (two) times daily for 16 days. 09/13/23 10/13/23  Alvan Dorothyann BIRCH, MD  topiramate  (TOPAMAX ) 50 MG tablet Take 1 tablet (50 mg total) by mouth 2 (two) times daily. 10/10/23   Alvan Dorothyann BIRCH, MD    Family History Family History  Problem Relation Age of Onset   Cancer Paternal Grandmother    Social History Social History   Tobacco Use   Smoking status: Former    Current packs/day: 0.25    Average packs/day: 0.3 packs/day for 10.1 years (2.5 ttl pk-yrs)    Types: Cigarettes    Start date: 09/09/2013   Smokeless tobacco: Never  Vaping Use   Vaping status: Every Day   Start date: 06/22/2029  Substance Use Topics   Alcohol use: Never   Drug use: No   Allergies   Aripiprazole  and Venlafaxine   Review of Systems Review of Systems Pertinent findings revealed after performing a 14 point review of systems has been noted in the history of present illness.  Physical Exam Vital Signs BP 122/82 (BP Location: Right Arm)   Pulse 92   Temp 97.8 F (36.6 C) (  Oral)   Resp 15   LMP 09/04/2023 (Exact Date)   SpO2 98%   No data found.  Physical Exam Vitals and nursing note reviewed.  Constitutional:      General: She is not in acute distress.    Appearance: Normal appearance. She is not ill-appearing.  HENT:     Head: Normocephalic and atraumatic.     Salivary Glands: Right salivary gland is not diffusely enlarged or tender. Left salivary gland is not diffusely enlarged or tender.     Right Ear: Tympanic membrane, ear canal and external ear normal. No drainage. No middle ear effusion. There is no impacted cerumen. Tympanic membrane  is not erythematous or bulging.     Left Ear: Tympanic membrane, ear canal and external ear normal. No drainage.  No middle ear effusion. There is no impacted cerumen. Tympanic membrane is not erythematous or bulging.     Nose: Congestion and rhinorrhea present. No nasal deformity, septal deviation or mucosal edema. Rhinorrhea is clear.     Right Turbinates: Not enlarged, swollen or pale.     Left Turbinates: Not enlarged, swollen or pale.     Right Sinus: No maxillary sinus tenderness or frontal sinus tenderness.     Left Sinus: No maxillary sinus tenderness or frontal sinus tenderness.     Mouth/Throat:     Lips: Pink. No lesions.     Mouth: Mucous membranes are moist. No oral lesions.     Pharynx: Oropharynx is clear. Uvula midline. No posterior oropharyngeal erythema or uvula swelling.     Tonsils: No tonsillar exudate. 0 on the right. 0 on the left.  Eyes:     General: Lids are normal.        Right eye: No discharge.        Left eye: No discharge.     Extraocular Movements: Extraocular movements intact.     Conjunctiva/sclera: Conjunctivae normal.     Right eye: Right conjunctiva is not injected.     Left eye: Left conjunctiva is not injected.  Neck:     Trachea: Trachea and phonation normal.  Cardiovascular:     Rate and Rhythm: Normal rate and regular rhythm.     Pulses: Normal pulses.     Heart sounds: Normal heart sounds. No murmur heard.    No friction rub. No gallop.  Pulmonary:     Effort: Pulmonary effort is normal. No accessory muscle usage, prolonged expiration or respiratory distress.     Breath sounds: Normal breath sounds. No stridor, decreased air movement or transmitted upper airway sounds. No decreased breath sounds, wheezing, rhonchi or rales.  Chest:     Chest wall: No tenderness.  Musculoskeletal:        General: Normal range of motion.     Cervical back: Normal range of motion and neck supple. Normal range of motion.  Lymphadenopathy:     Cervical: No  cervical adenopathy.  Skin:    General: Skin is warm and dry.     Findings: No erythema or rash.  Neurological:     General: No focal deficit present.     Mental Status: She is alert and oriented to person, place, and time.  Psychiatric:        Mood and Affect: Mood normal.        Behavior: Behavior normal.     Visual Acuity Right Eye Distance:   Left Eye Distance:   Bilateral Distance:    Right Eye Near:   Left Eye  Near:    Bilateral Near:     UC Couse / Diagnostics / Procedures:     Radiology No results found.  Procedures Procedures (including critical care time) EKG  Pending results:  Labs Reviewed  POC COVID19/FLU A&B COMBO    Medications Ordered in UC: Medications - No data to display  UC Diagnoses / Final Clinical Impressions(s)   I have reviewed the triage vital signs and the nursing notes.  Pertinent labs & imaging results that were available during my care of the patient were reviewed by me and considered in my medical decision making (see chart for details).    Final diagnoses:  Common cold   Lungs clear to auscultation bilaterally, physical exam findings concerning for upper respiratory infection, most likely common cold.  Patient advised to discontinue Synex at this time.  Patient provided with prescriptions for ipratropium nasal spray and Promethazine  DM for nighttime cough.  Conservative care recommended.  Return precautions advised.  Please see discharge instructions below for details of plan of care as provided to patient. ED Prescriptions     Medication Sig Dispense Auth. Provider   ipratropium (ATROVENT ) 0.06 % nasal spray Place 2 sprays into both nostrils 3 (three) times daily. As needed for nasal congestion, runny nose 15 mL Joesph Shaver Scales, PA-C   promethazine -dextromethorphan (PROMETHAZINE -DM) 6.25-15 MG/5ML syrup Take 5 mLs by mouth at bedtime as needed for cough. 60 mL Joesph Shaver Scales, PA-C      PDMP not reviewed this  encounter.  Pending results:  Labs Reviewed  POC COVID19/FLU A&B COMBO      Discharge Instructions      Your influenza and COVID-19 test were both negative.  I believe at this time, you are dealing with one of the many viruses circulating in our community right now.  While there are some very expensive viral panels that we could order today, none of them have a specific treatment and therefore would be of no benefit to you.  I believe we can safely say that your symptoms are consistent with a common cold without having to run up your bill.  Please read below to learn more about the medications, dosages and frequencies that I recommend to help alleviate your symptoms and to get you feeling better soon:   Atrovent  (ipratropium): This is an excellent nasal decongestant spray that does not cause rebound congestion.  Please instill 2 sprays into each nare with each use, you may use this up to 3 times daily.  Once you find that you are forgetting to use the spray more often that you remember to use it, you will know that you no longer need it.  I have sent a prescription for this medication to your pharmacy because it cannot be purchased over-the-counter.   Promethazine  DM: Promethazine  is both a nasal decongestant that dries up mucous membranes and an antinausea medication.  Promethazine  often makes most patients feel fairly sleepy.  DM is dextromethorphan, a single symptom reliever which is a cough suppressant found in many over-the-counter cough medications and combination cold preparations.  Please take 5 mL before bedtime to minimize your cough which will help you sleep better.  I have sent a prescription for this medication to your pharmacy because it cannot be purchased over-the-counter.   If symptoms have not meaningfully improved in the next 5 to 7 days, please return for repeat evaluation or follow-up with your regular provider.  If symptoms have worsened in the next 3 to 5 days, please  return for repeat evaluation or follow-up with your regular provider.    Thank you for visiting urgent care today.  We appreciate the opportunity to participate in your care.\      Disposition Upon Discharge:  Condition: stable for discharge home  Patient presented with an acute illness with associated systemic symptoms and significant discomfort requiring urgent management. In my opinion, this is a condition that a prudent lay person (someone who possesses an average knowledge of health and medicine) may potentially expect to result in complications if not addressed urgently such as respiratory distress, impairment of bodily function or dysfunction of bodily organs.   Routine symptom specific, illness specific and/or disease specific instructions were discussed with the patient and/or caregiver at length.   As such, the patient has been evaluated and assessed, work-up was performed and treatment was provided in alignment with urgent care protocols and evidence based medicine.  Patient/parent/caregiver has been advised that the patient may require follow up for further testing and treatment if the symptoms continue in spite of treatment, as clinically indicated and appropriate.  Patient/parent/caregiver has been advised to return to the [facility] or PCP if no better; to PCP or the Emergency Department if new signs and symptoms develop, or if the current signs or symptoms continue to change or worsen for further workup, evaluation and treatment as clinically indicated and appropriate  The patient will follow up with their current PCP if and as advised. If the patient does not currently have a PCP we will assist them in obtaining one.   The patient may need specialty follow up if the symptoms continue, in spite of conservative treatment and management, for further workup, evaluation, consultation and treatment as clinically indicated and appropriate.  Patient/parent/caregiver verbalized understanding  and agreement of plan as discussed.  All questions were addressed during visit.  Please see discharge instructions below for further details of plan.  This office note has been dictated using Teaching laboratory technician.  Unfortunately, this method of dictation can sometimes lead to typographical or grammatical errors.  I apologize for your inconvenience in advance if this occurs.  Please do not hesitate to reach out to me if clarification is needed.      Joesph Shaver Scales, [place] 10/01/23 [phone]

## 2023-10-01 NOTE — ED Triage Notes (Signed)
 Pt c/o chills, fever, congestion, and cough since Tuesday.   She took tylenol and dayquil around 4am today.

## 2023-10-01 NOTE — Discharge Instructions (Signed)
 Your influenza and COVID-19 test were both negative.  I believe at this time, you are dealing with one of the many viruses circulating in our community right now.  While there are some very expensive viral panels that we could order today, none of them have a specific treatment and therefore would be of no benefit to you.  I believe we can safely say that your symptoms are consistent with a common cold without having to run up your bill.  Please read below to learn more about the medications, dosages and frequencies that I recommend to help alleviate your symptoms and to get you feeling better soon:   Atrovent  (ipratropium): This is an excellent nasal decongestant spray that does not cause rebound congestion.  Please instill 2 sprays into each nare with each use, you may use this up to 3 times daily.  Once you find that you are forgetting to use the spray more often that you remember to use it, you will know that you no longer need it.  I have sent a prescription for this medication to your pharmacy because it cannot be purchased over-the-counter.   Promethazine  DM: Promethazine  is both a nasal decongestant that dries up mucous membranes and an antinausea medication.  Promethazine  often makes most patients feel fairly sleepy.  DM is dextromethorphan, a single symptom reliever which is a cough suppressant found in many over-the-counter cough medications and combination cold preparations.  Please take 5 mL before bedtime to minimize your cough which will help you sleep better.  I have sent a prescription for this medication to your pharmacy because it cannot be purchased over-the-counter.   If symptoms have not meaningfully improved in the next 5 to 7 days, please return for repeat evaluation or follow-up with your regular provider.  If symptoms have worsened in the next 3 to 5 days, please return for repeat evaluation or follow-up with your regular provider.    Thank you for visiting urgent care today.   We appreciate the opportunity to participate in your care.\

## 2023-10-26 ENCOUNTER — Ambulatory Visit: Payer: Self-pay | Admitting: Family Medicine

## 2023-10-26 NOTE — Telephone Encounter (Signed)
Copied from CRM [id number]. Topic: Appointments - Appointment Scheduling >> Oct 26, 2023  3:03 PM Twila L wrote: Patient/patient representative is calling to schedule an appointment. Refer to attachments for appointment information.   Chief Complaint: Fever Symptoms: fever 102.7, cough, stuffy nose, SOB with activity Pertinent Negatives: Patient denies chest pain Disposition: [] ED /[] Urgent Care (no appt availability in office) / [x] Appointment(In office/virtual)/ []  [facility]/ [] Home Care/ [] Refused Recommended Disposition /[] [facility]/ []  Follow-up with PCP Additional Notes: Patient stated she is having a fever, cough, and SOB with activity. Appointment scheduled for tomorrow.   Reason for Disposition  [1] Continuous (nonstop) coughing interferes with work or school AND [2] no improvement using cough treatment per Care Advice  Answer Assessment - Initial Assessment Questions 1. ONSET: "When did the cough begin?"      Yesterday  2. SEVERITY: "How bad is the cough today?"      Constant coughing, interferes with activity  3. SPUTUM: "Describe the color of your sputum" (none, dry cough; clear, white, yellow, green)     Dry cough  4. DIFFICULTY BREATHING: "Are you having difficulty breathing?" If Yes, ask: "How bad is it?" (e.g., mild, moderate, severe)    - MILD: No SOB at rest, mild SOB with walking, speaks normally in sentences, can lie down, no retractions, pulse < 100.    - MODERATE: SOB at rest, SOB with minimal exertion and prefers to sit, cannot lie down flat, speaks in phrases, mild retractions, audible wheezing, pulse 100-120.    - SEVERE: Very SOB at rest, speaks in single words, struggling to breathe, sitting hunched forward, retractions, pulse > 120      None at rest, Some SOB if walking a lot   5. FEVER: "Do you have a fever?" If Yes, ask: "What is your temperature, how was it measured, and when did it start?"     102.7    6. OTHER SYMPTOMS: "Do  you have any other symptoms?" (e.g., runny nose, wheezing, chest pain)     Stuffy nose, some pain over left eye  Protocols used: Cough - Acute Productive-A-AH

## 2023-10-27 ENCOUNTER — Ambulatory Visit: Payer: 59 | Admitting: Family Medicine

## 2023-10-27 ENCOUNTER — Other Ambulatory Visit: Payer: Self-pay | Admitting: Family Medicine

## 2023-10-27 ENCOUNTER — Encounter: Payer: Self-pay | Admitting: Family Medicine

## 2023-10-27 VITALS — BP 118/69 | HR 121 | Temp 99.9°F | Ht 62.5 in | Wt 176.0 lb

## 2023-10-27 DIAGNOSIS — R509 Fever, unspecified: Secondary | ICD-10-CM | POA: Diagnosis not present

## 2023-10-27 DIAGNOSIS — J101 Influenza due to other identified influenza virus with other respiratory manifestations: Secondary | ICD-10-CM

## 2023-10-27 DIAGNOSIS — G43819 Other migraine, intractable, without status migrainosus: Secondary | ICD-10-CM

## 2023-10-27 DIAGNOSIS — R051 Acute cough: Secondary | ICD-10-CM

## 2023-10-27 LAB — POCT INFLUENZA A/B
Influenza A, POC: POSITIVE — AB
Influenza B, POC: NEGATIVE

## 2023-10-27 LAB — POC COVID19 BINAXNOW: SARS Coronavirus 2 Ag: NEGATIVE

## 2023-10-27 MED ORDER — OSELTAMIVIR PHOSPHATE 75 MG PO CAPS: 75.0000 mg | ORAL_CAPSULE | Freq: Two times a day (BID) | ORAL | 0 refills | Status: DC

## 2023-10-27 NOTE — Progress Notes (Signed)
Acute Office Visit  Subjective:     Patient ID: Sandra Cross, female    DOB: 1996-03-11, 28 y.o.   MRN: [id number]  Chief Complaint  Patient presents with   Cough    X 2days    Fever    X1 day yesterday 102.7 this morning 101.6 she took dayquil. She also reports that she has headache,bodyache and feels weak    HPI Patient is in today for cough that started 2 days ago and fever that started yesterday with a temperature of 102.7.  Took some DayQuil.  She is also been experiencing headache, body aches and just feeling weak in general.  ROS      Objective:    BP 118/69   Pulse (!) 121   Temp 99.9 F (37.7 C) (Oral)   Ht 5' 2.5" (1.588 m)   Wt 176 lb (79.8 kg)   LMP 09/04/2023 (Exact Date)   SpO2 100%   BMI 31.68 kg/m    Physical Exam Constitutional:      Appearance: Normal appearance.  HENT:     Head: Normocephalic and atraumatic.     Right Ear: Tympanic membrane, ear canal and external ear normal. There is no impacted cerumen.     Left Ear: Tympanic membrane, ear canal and external ear normal. There is no impacted cerumen.     Nose: Nose normal.     Mouth/Throat:     Pharynx: Oropharynx is clear.  Eyes:     Conjunctiva/sclera: Conjunctivae normal.  Cardiovascular:     Rate and Rhythm: Normal rate and regular rhythm.  Pulmonary:     Effort: Pulmonary effort is normal.     Breath sounds: Normal breath sounds.  Musculoskeletal:     Cervical back: Neck supple. No tenderness.  Lymphadenopathy:     Cervical: No cervical adenopathy.  Skin:    General: Skin is warm and dry.  Neurological:     Mental Status: She is alert and oriented to person, place, and time.  Psychiatric:        Mood and Affect: Mood normal.     Results for orders placed or performed in visit on 10/27/23  POCT Influenza A/B  Result Value Ref Range   Influenza A, POC Positive (A) Negative   Influenza B, POC Negative Negative  POC COVID-19  Result Value Ref Range   SARS  Coronavirus 2 Ag Negative Negative        Assessment & Plan:   Problem List Items Addressed This Visit   None Visit Diagnoses       Fever, unspecified fever cause    -  Primary   Relevant Orders   POCT Influenza A/B (Completed)   POC COVID-19 (Completed)     Acute cough       Relevant Orders   POCT Influenza A/B (Completed)   POC COVID-19 (Completed)     Influenza A       Relevant Medications   oseltamivir (TAMIFLU) 75 MG capsule       FLu A -  treat with Tamiflu.  Okay to continue symptomatic care.  If not improving then please let me know.  Meds ordered this encounter  Medications   oseltamivir (TAMIFLU) 75 MG capsule    Sig: Take 1 capsule (75 mg total) by mouth 2 (two) times daily.    Dispense:  10 capsule    Refill:  0    Return if symptoms worsen or fail to improve.  Nani Gasser, MD

## 2023-11-15 ENCOUNTER — Encounter: Payer: Self-pay | Admitting: Family Medicine

## 2023-11-15 ENCOUNTER — Ambulatory Visit (INDEPENDENT_AMBULATORY_CARE_PROVIDER_SITE_OTHER): Payer: 59 | Admitting: Family Medicine

## 2023-11-15 VITALS — BP 121/64 | HR 102 | Ht 62.5 in | Wt 174.0 lb

## 2023-11-15 DIAGNOSIS — H5711 Ocular pain, right eye: Secondary | ICD-10-CM

## 2023-11-15 DIAGNOSIS — N923 Ovulation bleeding: Secondary | ICD-10-CM | POA: Diagnosis not present

## 2023-11-15 DIAGNOSIS — G43819 Other migraine, intractable, without status migrainosus: Secondary | ICD-10-CM

## 2023-11-15 MED ORDER — NORGESTIMATE-ETH ESTRADIOL 0.25-35 MG-MCG PO TABS: 1.0000 | ORAL_TABLET | Freq: Every day | ORAL | 1 refills | Status: DC

## 2023-11-15 NOTE — Assessment & Plan Note (Signed)
Doing well on Topamax so far working to stay at 50 mg twice a day and send a refill to the pharmacy.  Call if any problems or concerns we can always adjust the dose up if needed continue with Relpax as needed.  Does have pain in her right eye today which feels a little bit different than her usual migraine.  We did discuss doing a nasal saline rinse to see if that would help over the next couple days and if not then please let me know she wonders if it could be the beginning of a sinus infection.  Though she is not having a lot of significant congestion just mostly at night.

## 2023-11-15 NOTE — Progress Notes (Signed)
Established Patient Office Visit  Subjective  Patient ID: Sandra Cross, female    DOB: December 06, 1995  Age: 28 y.o. MRN: [id number]  Chief Complaint  Patient presents with   Migraine    HPI  F/U Migraines - we recently changed to topamax, after trying propranolol which seem to quit working.  She does use the Relpax as needed and that does seem to be really helping.  Far she has been tolerating the Topamax well without any side effects or problems.  She does feel like it has slightly decreased her appetite.  She is still having irregular.  She has had her Nexplanon in for about 18 months and she said she will bleed for about 10 days and she will have maybe 5 days where she stops bleeding and then she will bleed for 10 days and then stops for 5 again.  She is getting frustrated with having to wear some type of tampon etc. that frequently.  Now she is currently battling a headache in her right eye it has been there for about a week she says the Relpax does not touch it so she really thinks it could be from her sinuses.  The eye feels sensitive as well the only thing that seems to provide some relief is Advil.     ROS    Objective:     BP 121/64   Pulse (!) 102   Ht 5' 2.5" (1.588 m)   Wt 174 lb (78.9 kg)   SpO2 100%   BMI 31.32 kg/m     Physical Exam Vitals and nursing note reviewed.  Constitutional:      Appearance: Normal appearance.  HENT:     Head: Normocephalic and atraumatic.  Eyes:     Conjunctiva/sclera: Conjunctivae normal.  Cardiovascular:     Rate and Rhythm: Normal rate and regular rhythm.  Pulmonary:     Effort: Pulmonary effort is normal.     Breath sounds: Normal breath sounds.  Skin:    General: Skin is warm and dry.  Neurological:     Mental Status: She is alert.  Psychiatric:        Mood and Affect: Mood normal.      No results found for any visits on 11/15/23.     The ASCVD Risk score (Arnett DK, et al., 2019) failed to calculate for  the following reasons:   The 2019 ASCVD risk score is only valid for ages 70 to 66    Assessment & Plan:   Problem List Items Addressed This Visit       Cardiovascular and Mediastinum   Migraine - Primary   Doing well on Topamax so far working to stay at 50 mg twice a day and send a refill to the pharmacy.  Call if any problems or concerns we can always adjust the dose up if needed continue with Relpax as needed.  Does have pain in her right eye today which feels a little bit different than her usual migraine.  We did discuss doing a nasal saline rinse to see if that would help over the next couple days and if not then please let me know she wonders if it could be the beginning of a sinus infection.  Though she is not having a lot of significant congestion just mostly at night.      Other Visit Diagnoses       Acute right eye pain         Intermenstrual bleeding  Relevant Medications   norgestimate-ethinyl estradiol (ORTHO-CYCLEN) 0.25-35 MG-MCG tablet       Intermenstrual bleeding on Nexplanon-we discussed options we will go ahead and put her on a round of combined estrogen and progesterone for 1 month to see if we can reset bleeding.  Otherwise she would prefer to have it taken out.    Return in about 3 months (around 02/12/2024) for Migarines .    Nani Gasser, MD

## 2023-11-30 ENCOUNTER — Encounter: Payer: Self-pay | Admitting: Family Medicine

## 2023-12-01 NOTE — Telephone Encounter (Signed)
 Ok to schedule with joy in 40 min for IUD removal later this month.

## 2023-12-08 ENCOUNTER — Other Ambulatory Visit: Payer: Self-pay | Admitting: Family Medicine

## 2023-12-08 DIAGNOSIS — N923 Ovulation bleeding: Secondary | ICD-10-CM

## 2023-12-14 ENCOUNTER — Ambulatory Visit: Admitting: Medical-Surgical

## 2023-12-14 ENCOUNTER — Encounter: Payer: Self-pay | Admitting: Medical-Surgical

## 2023-12-14 VITALS — BP 116/76 | HR 98 | Resp 20 | Ht 62.5 in | Wt 171.0 lb

## 2023-12-14 DIAGNOSIS — Z3046 Encounter for surveillance of implantable subdermal contraceptive: Secondary | ICD-10-CM

## 2023-12-14 NOTE — Progress Notes (Signed)
        Established patient visit  History, exam, impression, and plan:  1. Nexplanon removal (Primary) Pleasant 28 year old female presenting today for removal of her Nexplanon.  Her Nexplanon was placed on 07/27/2022 by myself here in our office.  Since placement, she has had issues with irregular bleeding and spotting.  This initially was not a problem as she was aware that it takes at least 6 months for the bleeding pattern to settle in however she has only had approximately 2 months since it was placed where she did not have daily bleeding.  Recently, she talked to her PCP regarding the irregular bleeding and spotting.  She was placed on oral birth control on top of the Nexplanon but unfortunately this only made the bleeding heavier.  She stopped oral contraceptives with hopes that it would return to previous bleeding patterns however the flow remained heavy.  At this point, she would like to give her body a break from hormonal contraceptives.  See below for procedure note.   Procedures performed this visit: PROCEDURE NOTE: NEXPLANON  REMOVAL  Patient given informed consent and signed copy in the chart for both procedures. an appropriate time out was been taken. Left arm area prepped and draped in the usual sterile fashion.  Two cc of 1% lidocaine with epinephrine used for local anesthesia. A small stab incision was made close to the nexplanon with scalpel. Hemostats were used to withdraw the Nexplanon. Bleeding minimal.  Incision covered with steri-strip and pressure dressing.  There were no complications and patient tolerated well.  Patient given follow up instructions should she experience redness, swelling at sight or fever in the next 24 hours.  Return if symptoms worsen or fail to improve.  __________________________________ Thayer Ohm, DNP, APRN, FNP-BC Primary Care and Sports Medicine [facility] [place]

## 2023-12-23 ENCOUNTER — Other Ambulatory Visit: Payer: Self-pay | Admitting: Family Medicine

## 2023-12-23 DIAGNOSIS — G43819 Other migraine, intractable, without status migrainosus: Secondary | ICD-10-CM

## 2024-01-03 ENCOUNTER — Encounter: Payer: Self-pay | Admitting: Family Medicine

## 2024-02-14 ENCOUNTER — Encounter: Payer: Self-pay | Admitting: Family Medicine

## 2024-02-14 ENCOUNTER — Ambulatory Visit: Payer: 59 | Admitting: Family Medicine

## 2024-02-14 VITALS — BP 129/71 | HR 105 | Ht 62.5 in | Wt 167.1 lb

## 2024-02-14 DIAGNOSIS — F411 Generalized anxiety disorder: Secondary | ICD-10-CM | POA: Diagnosis not present

## 2024-02-14 DIAGNOSIS — F321 Major depressive disorder, single episode, moderate: Secondary | ICD-10-CM

## 2024-02-14 DIAGNOSIS — G43819 Other migraine, intractable, without status migrainosus: Secondary | ICD-10-CM

## 2024-02-14 DIAGNOSIS — E531 Pyridoxine deficiency: Secondary | ICD-10-CM | POA: Diagnosis not present

## 2024-02-14 DIAGNOSIS — Z832 Family history of diseases of the blood and blood-forming organs and certain disorders involving the immune mechanism: Secondary | ICD-10-CM

## 2024-02-14 MED ORDER — SERTRALINE HCL 25 MG PO TABS: ORAL_TABLET | ORAL | 1 refills | Status: DC

## 2024-02-14 NOTE — Assessment & Plan Note (Signed)
 Discussed treatment options we will start with sertraline  discussed potential side effects of the medication otherwise doing well follow-up in 6 weeks we will make any adjustments needed at that time.  We also discussed referral with for therapist.  Encouraged her to really think about it.

## 2024-02-14 NOTE — Progress Notes (Signed)
 Established Patient Office Visit  Subjective  Patient ID: Sandra Cross, female    DOB: January 23, 1996  Age: 28 y.o. MRN: [id number]  Chief Complaint  Patient presents with   Migraine    HPI F/U Migraines -she is doing well overall though last week she did have quite a few headaches but feels like they was really stress-induced in fact she had a panic attack last week but in general she feels that the Topamax  is working well and says the Relpax  works when she takes it.  She did have the Nexplanon  removed and says her cycles are back on track and that has been helpful.  Mood-she is just really felt like her anxiety is ramped up particularly in the last month.  Work has been stressful as well as just home life.  She has noticed that she is chewing on her shirt which she used to do when she was a kid and she get really anxious.  She reports that her GAD-7 score is 21 today also reports some mild symptoms of depression as well she is not currently on any medication or SSRI.  Would also like to have an HLA-B27 checked.  She has a family history of autoimmune disorders in her mom.  B6 was low last fall so she is due to recheck that as well.    ROS    Objective:     BP 129/71   Pulse (!) 105   Ht 5' 2.5" (1.588 m)   Wt 167 lb 1.9 oz (75.8 kg)   SpO2 100%   BMI 30.08 kg/m    Physical Exam Vitals and nursing note reviewed.  Constitutional:      Appearance: Normal appearance.  HENT:     Head: Normocephalic and atraumatic.  Eyes:     Conjunctiva/sclera: Conjunctivae normal.  Cardiovascular:     Rate and Rhythm: Normal rate and regular rhythm.  Pulmonary:     Effort: Pulmonary effort is normal.     Breath sounds: Normal breath sounds.  Skin:    General: Skin is warm and dry.  Neurological:     Mental Status: She is alert.  Psychiatric:        Mood and Affect: Mood normal.      No results found for any visits on 02/14/24.    The ASCVD Risk score (Arnett DK, et  al., 2019) failed to calculate for the following reasons:   The 2019 ASCVD risk score is only valid for ages 75 to 8    Assessment & Plan:   Problem List Items Addressed This Visit       Cardiovascular and Mediastinum   Migraine - Primary   Doing well overall.  She is happy with current regimen.  Did have several HA last week she feels bc of stress but feels o/w the topamax  is working well. Relpax  works well too.       Relevant Medications   sertraline  (ZOLOFT ) 25 MG tablet   Other Relevant Orders   HLA-B27 antigen   Vitamin B6     Other   Moderate single current episode of major depressive disorder (HCC)   Relevant Medications   sertraline  (ZOLOFT ) 25 MG tablet   GAD (generalized anxiety disorder)   Discussed treatment options we will start with sertraline  discussed potential side effects of the medication otherwise doing well follow-up in 6 weeks we will make any adjustments needed at that time.  We also discussed referral with for therapist.  Encouraged her  to really think about it.      Relevant Medications   sertraline  (ZOLOFT ) 25 MG tablet   Other Visit Diagnoses       Inadequate vitamin B6 intake       Relevant Orders   HLA-B27 antigen   Vitamin B6     Family history of autoimmune disorder       Relevant Orders   HLA-B27 antigen   Vitamin B6       Return in about 6 weeks (around 03/27/2024) for New start medication, Mood.    Duaine German, MD

## 2024-02-14 NOTE — Assessment & Plan Note (Signed)
 Doing well overall.  She is happy with current regimen.  Did have several HA last week she feels bc of stress but feels o/w the topamax  is working well. Relpax  works well too.

## 2024-02-19 LAB — VITAMIN B6: Vitamin B6: 3.8 ug/L (ref 3.4–65.2)

## 2024-02-19 LAB — HLA-B27 ANTIGEN: HLA B27: NEGATIVE

## 2024-02-20 ENCOUNTER — Ambulatory Visit: Payer: Self-pay | Admitting: Family Medicine

## 2024-02-20 NOTE — Progress Notes (Signed)
 OurHLA B 27 is negative your B6 is low. Please pick up B6 over the counter and start taking one daily.

## 2024-03-07 ENCOUNTER — Other Ambulatory Visit: Payer: Self-pay | Admitting: Family Medicine

## 2024-03-08 NOTE — Telephone Encounter (Signed)
 Hold for 03/27/24 appt with Dr. Greer Leak

## 2024-03-27 ENCOUNTER — Ambulatory Visit: Admitting: Family Medicine

## 2024-03-27 ENCOUNTER — Encounter: Payer: Self-pay | Admitting: Family Medicine

## 2024-03-27 VITALS — BP 121/72 | HR 95 | Ht 62.5 in | Wt 163.0 lb

## 2024-03-27 DIAGNOSIS — F411 Generalized anxiety disorder: Secondary | ICD-10-CM | POA: Diagnosis not present

## 2024-03-27 DIAGNOSIS — F331 Major depressive disorder, recurrent, moderate: Secondary | ICD-10-CM | POA: Diagnosis not present

## 2024-03-27 MED ORDER — RISPERIDONE 0.5 MG PO TABS: 0.5000 mg | ORAL_TABLET | Freq: Every day | ORAL | 1 refills | Status: DC

## 2024-03-27 MED ORDER — SERTRALINE HCL 100 MG PO TABS: 100.0000 mg | ORAL_TABLET | Freq: Every day | ORAL | 1 refills | Status: DC

## 2024-03-27 NOTE — Assessment & Plan Note (Signed)
 Discussed options. Will do  trial of low dose risperdal.  0.5mg   F/U in 6 weeks.

## 2024-03-27 NOTE — Progress Notes (Signed)
 Established Patient Office Visit  Subjective  Patient ID: Sandra Cross, female    DOB: 02-May-1996  Age: 28 y.o. MRN: [id number]  Chief Complaint  Patient presents with   Medical Management of Chronic Issues    HPI  F/U GAD - she started the sertraline .  She feels like her anxiety is doing better.  She has been tolerating the medication well so far she is up to 50 mg and is here today for follow-up.  Her biggest issue is she just feels edgy and irritable.  She feels like she is more aggressive than feelings of paying somebody back for what they did to her.  And sometimes that Ambs things up.  She says she is actually sleeping well.  She has had some depressions of feeling down and depressed some days of the week.  No thoughts of wanting to harm herself she really feels like she would like to be back on a mood stabilizer she was previously on Abilify  and felt like it was helpful but it just caused a lot of nausea so she ended up stopping it.    ROS    Objective:     BP 121/72   Pulse 95   Ht 5' 2.5 (1.588 m)   Wt 163 lb 0.6 oz (74 kg)   SpO2 97%   BMI 29.35 kg/m    Physical Exam Vitals and nursing note reviewed.  Constitutional:      Appearance: Normal appearance.  HENT:     Head: Normocephalic and atraumatic.   Eyes:     Conjunctiva/sclera: Conjunctivae normal.    Cardiovascular:     Rate and Rhythm: Normal rate and regular rhythm.  Pulmonary:     Effort: Pulmonary effort is normal.     Breath sounds: Normal breath sounds.   Skin:    General: Skin is warm and dry.   Neurological:     Mental Status: She is alert.   Psychiatric:        Mood and Affect: Mood normal.      No results found for any visits on 03/27/24.    The ASCVD Risk score (Arnett DK, et al., 2019) failed to calculate for the following reasons:   The 2019 ASCVD risk score is only valid for ages 72 to 64    Assessment & Plan:   Problem List Items Addressed This Visit        Other   MDD (major depressive disorder), recurrent episode, moderate (HCC)   Discussed options. Will do  trial of low dose risperdal.  0.5mg   F/U in 6 weeks.        Relevant Medications   sertraline  (ZOLOFT ) 100 MG tablet   risperiDONE (RISPERDAL) 0.5 MG tablet   GAD (generalized anxiety disorder) - Primary   Still having some residual anxiety symptoms but overall much better.  So we will go ahead and increase her sertraline  to 100 mg.  New prescription sent. Sig reduction in GAD 7 score.      03/27/2024    7:45 AM 02/14/2024    9:15 AM 12/14/2023    3:38 PM 11/15/2023    9:00 AM  GAD 7 : Generalized Anxiety Score  Nervous, Anxious, on Edge 1 3 2 3   Control/stop worrying 1 3 2 3   Worry too much - different things 1 3 2 3   Trouble relaxing 0 3 1 1   Restless 1 3 1 1   Easily annoyed or irritable 3 3 3 2   Afraid -  awful might happen 0 3 3 3   Total GAD 7 Score 7 21 14 16   Anxiety Difficulty Somewhat difficult Extremely difficult Somewhat difficult Somewhat difficult          Relevant Medications   sertraline  (ZOLOFT ) 100 MG tablet    Return in about 6 weeks (around 05/08/2024) for Mood.    Dorothyann Byars, MD

## 2024-03-27 NOTE — Assessment & Plan Note (Signed)
 Still having some residual anxiety symptoms but overall much better.  So we will go ahead and increase her sertraline  to 100 mg.  New prescription sent. Sig reduction in GAD 7 score.      03/27/2024    7:45 AM 02/14/2024    9:15 AM 12/14/2023    3:38 PM 11/15/2023    9:00 AM  GAD 7 : Generalized Anxiety Score  Nervous, Anxious, on Edge 1 3 2 3   Control/stop worrying 1 3 2 3   Worry too much - different things 1 3 2 3   Trouble relaxing 0 3 1 1   Restless 1 3 1 1   Easily annoyed or irritable 3 3 3 2   Afraid - awful might happen 0 3 3 3   Total GAD 7 Score 7 21 14 16   Anxiety Difficulty Somewhat difficult Extremely difficult Somewhat difficult Somewhat difficult

## 2024-03-27 NOTE — Progress Notes (Signed)
 Sandra Cross reports that her anxiety has gotten better. However she feels that she needs something to stabilize her mood.

## 2024-04-10 ENCOUNTER — Encounter: Payer: Self-pay | Admitting: Family Medicine

## 2024-04-26 ENCOUNTER — Other Ambulatory Visit: Payer: Self-pay | Admitting: Family Medicine

## 2024-04-26 DIAGNOSIS — F331 Major depressive disorder, recurrent, moderate: Secondary | ICD-10-CM

## 2024-05-11 ENCOUNTER — Ambulatory Visit: Admitting: Family Medicine

## 2024-05-11 ENCOUNTER — Encounter: Payer: Self-pay | Admitting: Family Medicine

## 2024-05-11 VITALS — BP 104/60 | HR 75 | Ht 62.5 in | Wt 165.1 lb

## 2024-05-11 DIAGNOSIS — G43819 Other migraine, intractable, without status migrainosus: Secondary | ICD-10-CM

## 2024-05-11 DIAGNOSIS — F411 Generalized anxiety disorder: Secondary | ICD-10-CM

## 2024-05-11 MED ORDER — TOPIRAMATE 50 MG PO TABS: 50.0000 mg | ORAL_TABLET | Freq: Two times a day (BID) | ORAL | 1 refills | Status: AC

## 2024-05-11 MED ORDER — ELETRIPTAN HYDROBROMIDE 20 MG PO TABS: 20.0000 mg | ORAL_TABLET | ORAL | 6 refills | Status: DC | PRN

## 2024-05-11 NOTE — Assessment & Plan Note (Signed)
 You okay with migraines does not need any refills today.

## 2024-05-11 NOTE — Assessment & Plan Note (Signed)
 Zoloft  100 mg daily and risperidone  0.5 mg daily at bedtime.

## 2024-05-11 NOTE — Progress Notes (Signed)
   Established Patient Office Visit  Subjective  Patient ID: Sandra Cross, female    DOB: 1996-03-11  Age: 28 y.o. MRN: [id number]  Chief Complaint  Patient presents with   Medical Management of Chronic Issues    Mood      6 week f/u for GAD. She is doing well.  Happy with current regimen.  She did move it to bedtime as it was causing some nausea when she was taking it in the morning but that seems to have improved by changing the timing.  She still feels like she has a little edginess and irritability but overall feels like it is working well.   HPI    ROS    Objective:     BP 104/60   Pulse 75   Ht 5' 2.5 (1.588 m)   Wt 165 lb 1.3 oz (74.9 kg)   SpO2 100%   BMI 29.71 kg/m    Physical Exam Vitals and nursing note reviewed.  Constitutional:      Appearance: Normal appearance.  HENT:     Head: Normocephalic and atraumatic.  Eyes:     Conjunctiva/sclera: Conjunctivae normal.  Cardiovascular:     Rate and Rhythm: Normal rate and regular rhythm.  Pulmonary:     Effort: Pulmonary effort is normal.     Breath sounds: Normal breath sounds.  Skin:    General: Skin is warm and dry.  Neurological:     Mental Status: She is alert.  Psychiatric:        Mood and Affect: Mood normal.      No results found for any visits on 05/11/24.    The ASCVD Risk score (Arnett DK, et al., 2019) failed to calculate for the following reasons:   The 2019 ASCVD risk score is only valid for ages 80 to 35    Assessment & Plan:   Problem List Items Addressed This Visit       Cardiovascular and Mediastinum   Migraine   You okay with migraines does not need any refills today.      Relevant Medications   eletriptan  (RELPAX ) 20 MG tablet   topiramate  (TOPAMAX ) 50 MG tablet     Other   GAD (generalized anxiety disorder) - Primary   Zoloft  100 mg daily and risperidone  0.5 mg daily at bedtime.       Return in about 4 months (around 09/10/2024) for Mood.     Dorothyann Byars, MD

## 2024-05-31 ENCOUNTER — Encounter: Payer: Self-pay | Admitting: Family Medicine

## 2024-05-31 ENCOUNTER — Ambulatory Visit: Admitting: Family Medicine

## 2024-05-31 VITALS — BP 111/67 | HR 94 | Ht 62.5 in | Wt 163.0 lb

## 2024-05-31 DIAGNOSIS — J019 Acute sinusitis, unspecified: Secondary | ICD-10-CM | POA: Diagnosis not present

## 2024-05-31 DIAGNOSIS — B9789 Other viral agents as the cause of diseases classified elsewhere: Secondary | ICD-10-CM | POA: Diagnosis not present

## 2024-05-31 LAB — POC COVID19/FLU A&B COMBO
Covid Antigen, POC: NEGATIVE
Influenza A Antigen, POC: NEGATIVE
Influenza B Antigen, POC: NEGATIVE

## 2024-05-31 NOTE — Progress Notes (Signed)
 Acute Office Visit  Subjective:     Patient ID: Sandra Cross, female    DOB: 1996-08-08, 28 y.o.   MRN: [id number]  Chief Complaint  Patient presents with   Cough    Congestion x 4 days    HPI Patient is in today for Discussed the use of Sandra scribe software for clinical note transcription with the patient, who gave verbal consent to proceed.  History of Present Illness Sandra Cross is a 28 year old female who presents with upper respiratory symptoms including congestion and cough.  Upper respiratory symptoms - Onset of symptoms four days ago - Initial sore throat, now resolved - Progression to significant nasal congestion and sneezing - Sensation of head pressure, described as 'like a balloon' - Clogged ears with pressure sensation, no ear pain - No fever since onset of symptoms  Cough and chest discomfort - Persistent cough - Chest pain with coughing - No desire to cough  Musculoskeletal symptoms - Mild back pain - No other body aches  Symptom management - Using nasal spray similar to Afrin, provides temporary relief for about four hours - Using Mucinex for symptom control    ROS      Objective:    BP 111/67   Pulse 94   Ht 5' 2.5 (1.588 m)   Wt 163 lb (73.9 kg)   SpO2 100%   BMI 29.34 kg/m    Physical Exam Constitutional:      Appearance: Normal appearance.  HENT:     Head: Normocephalic and atraumatic.     Right Ear: Tympanic membrane, ear canal and external ear normal. There is no impacted cerumen.     Left Ear: Tympanic membrane, ear canal and external ear normal. There is no impacted cerumen.     Nose: Nose normal.     Mouth/Throat:     Pharynx: Oropharynx is clear.  Eyes:     Conjunctiva/sclera: Conjunctivae normal.  Cardiovascular:     Rate and Rhythm: Normal rate and regular rhythm.  Pulmonary:     Effort: Pulmonary effort is normal.     Breath sounds: Normal breath sounds.  Musculoskeletal:     Cervical back: Neck  supple. No tenderness.  Lymphadenopathy:     Cervical: No cervical adenopathy.  Skin:    General: Skin is warm and dry.  Neurological:     Mental Status: She is alert and oriented to person, place, and time.  Psychiatric:        Mood and Affect: Mood normal.     No results found for any visits on 05/31/24.      Assessment & Plan:   Problem List Items Addressed This Visit   None Visit Diagnoses       Acute viral sinusitis    -  Primary      Assessment and Plan Assessment & Plan Acute viral sinusitis Acute viral sinusitis with symptoms for four days, negative COVID test, and clear chest exam. Viral etiology suspected. Focus on symptom relief and preventing bacterial superinfection. - Recommend Afrin nasal spray for up to three days. - Suggest oral decongestant like Sudafed if tolerated. - Advise nasal steroid spray such as Flonase or Nasonex, spaced ten minutes from Afrin. - Encourage nasal saline irrigation to prevent bacterial infection. - Advise hydration and steam inhalation for congestion relief. - Provide work note for today, advise rest and fluid intake. - Instruct to send MyChart message if no improvement by week's end for potential antibiotics.  No orders of the defined types were placed in this encounter.   No follow-ups on file.  Dorothyann Byars, MD

## 2024-05-31 NOTE — Addendum Note (Signed)
 Addended by: OLEY CHIQUITA CROME on: 05/31/2024 01:47 PM   Modules accepted: Orders

## 2024-06-02 ENCOUNTER — Encounter: Payer: Self-pay | Admitting: Family Medicine

## 2024-06-02 MED ORDER — AMOXICILLIN 875 MG PO TABS: 875.0000 mg | ORAL_TABLET | Freq: Two times a day (BID) | ORAL | 0 refills | Status: DC

## 2024-06-26 ENCOUNTER — Ambulatory Visit: Payer: Self-pay

## 2024-06-26 ENCOUNTER — Encounter: Payer: Self-pay | Admitting: Family Medicine

## 2024-06-26 ENCOUNTER — Ambulatory Visit: Admitting: Family Medicine

## 2024-06-26 VITALS — BP 118/70 | HR 87 | Temp 97.6°F | Ht 62.5 in | Wt 168.4 lb

## 2024-06-26 DIAGNOSIS — S93402A Sprain of unspecified ligament of left ankle, initial encounter: Secondary | ICD-10-CM | POA: Diagnosis not present

## 2024-06-26 NOTE — Progress Notes (Signed)
 OFFICE VISIT  06/26/2024  CC:  Chief Complaint  Patient presents with   Acute Visit    Foot injury    Patient is a 28 y.o. female who presents for left foot injury.  HPI: 2 days ago when walking in high heels she stepped on uneven ground and twisted ankle/inversion injury, left.  She did this a few times at night.  She was able to get up and walk around after the first couple but says after the last when she took her heel off and had to wait a minute to get up and limp.  It swelled a little bit.  No bruising. She has taken some ibuprofen.  History reviewed. No pertinent past medical history.  Past Surgical History:  Procedure Laterality Date   CHOLECYSTECTOMY N/A 07/08/2022   Procedure: LAPAROSCOPIC CHOLECYSTECTOMY WITH INTRAOPERATIVE CHOLANGIOGRAM;  Surgeon: Curvin Deward MOULD, MD;  Location: [facility];  Service: General;  Laterality: N/A;    Outpatient Medications Prior to Visit  Medication Sig Dispense Refill   albuterol (VENTOLIN HFA) 108 (90 Base) MCG/ACT inhaler Inhale 1-2 puffs into the lungs every 6 (six) hours as needed for wheezing or shortness of breath.     risperiDONE  (RISPERDAL ) 0.5 MG tablet TAKE 1 TABLET BY MOUTH AT BEDTIME. 90 tablet 0   sertraline  (ZOLOFT ) 100 MG tablet Take 1 tablet (100 mg total) by mouth daily. 90 tablet 1   topiramate  (TOPAMAX ) 50 MG tablet Take 1 tablet (50 mg total) by mouth 2 (two) times daily. 180 tablet 1   amoxicillin  (AMOXIL ) 875 MG tablet Take 1 tablet (875 mg total) by mouth 2 (two) times daily. (Patient not taking: Reported on 06/26/2024) 14 tablet 0   eletriptan  (RELPAX ) 20 MG tablet Take 1 tablet (20 mg total) by mouth as needed for migraine or headache. May repeat in 2 hours if headache persists or recurs. (Patient not taking: Reported on 06/26/2024) 10 tablet 6   No facility-administered medications prior to visit.    Allergies  Allergen Reactions   Aripiprazole  Nausea Only   Venlafaxine  Nausea Only    Review of Systems  As per  HPI  PE:    06/26/2024   12:56 PM 05/31/2024   11:55 AM 05/11/2024    8:26 AM  Vitals with BMI  Height 5' 2.5 5' 2.5 5' 2.5  Weight 168 lbs 6 oz 163 lbs 165 lbs 1 oz  BMI 30.29 29.32 29.69  Systolic 118 111 895  Diastolic 70 67 60  Pulse 87 94 75     Physical Exam  Left ankle without any soft tissue swelling or bruising. She has tenderness to palpation over the lateral malleolus extending down into the general lateral aspect of the ankle/foot.  Mild tenderness at the base of the fifth metatarsal. Anterior drawer and talar tilt do not demonstrate any excessive laxity. She has full active flexion, extension, inversion, and eversion of the ankle although inversion and plantarflexion do elicit some pain.  LABS:  Last CBC Lab Results  Component Value Date   WBC 9.5 06/29/2023   HGB 13.1 06/29/2023   HCT 41.2 06/29/2023   MCV 87 06/29/2023   MCH 27.5 06/29/2023   RDW 13.7 06/29/2023   PLT 388 06/29/2023   Last metabolic panel Lab Results  Component Value Date   GLUCOSE 80 06/29/2023   NA 139 06/29/2023   K 4.2 06/29/2023   CL 102 06/29/2023   CO2 21 06/29/2023   BUN 10 06/29/2023   CREATININE 0.70 06/29/2023  EGFR 122 06/29/2023   CALCIUM 9.1 06/29/2023   PROT 6.9 06/29/2023   ALBUMIN 4.5 06/29/2023   LABGLOB 2.4 06/29/2023   BILITOT 0.3 06/29/2023   ALKPHOS 76 06/29/2023   AST 13 06/29/2023   ALT 10 06/29/2023   IMPRESSION AND PLAN:  #1 lateral ankle sprain, left side. Bedside MS[facility] ultrasound today showed no step-off or hyperemia of the distal fibula.  Peroneal tendons fully intact and without surrounding fluid.  Peroneus brevis with fully intact/normal attachment at the base of the fifth metatarsal.  Anterior tibiofibular and ATFL without any disruption of the fibers or edema.  Fitted today with ankle lace up support. Rest, elevate, ibuprofen 400 mg twice daily with food. Home ankle rehab exercises printed for patient.  An After Visit Summary was printed  and given to the patient.  FOLLOW UP: Return in about 1 week (around 07/03/2024) for f/u pcp for ankle sprain.  Signed:  Gerlene Hockey, MD           06/26/2024

## 2024-06-26 NOTE — Patient Instructions (Addendum)
 Ankle Sprain, Phase I Rehab An ankle sprain is an injury to the tissues that connect bone to bone (ligaments) in your ankle. Ankle sprains can cause stiffness, loss of motion, and loss of strength. Ask your health care provider which exercises are safe for you. Do exercises exactly as told by your provider and adjust them as directed. It is normal to feel mild stretching, pulling, tightness, or discomfort as you do these exercises. Stop right away if you feel sudden pain or your pain gets worse. Do not begin these exercises until told by your provider. Stretching and range-of-motion exercises These exercises warm up your muscles and joints. They can improve the movement and flexibility of your lower leg and ankle. They also help to relieve pain and stiffness. Gastroc and soleus stretch This exercise is also called a calf stretch. It stretches the muscles in the back of the lower leg. These muscles are the gastrocnemius, or gastroc, and the soleus. Sit on the floor with your left / right leg extended. Loop a belt or towel around the ball of your left / right foot. The ball of your foot is on the walking surface, right under your toes. Keep your left / right ankle and foot relaxed and keep your knee straight. Use the belt or towel to pull your foot toward you. You should feel a gentle stretch behind your calf or knee in your gastroc muscle. Hold this position for __________ seconds, then release to the starting position. Repeat the exercise with your knee bent. You can put a pillow or a rolled bath towel under your knee to support it. You should feel a stretch deep in your calf in the soleus muscle or at your Achilles tendon. Repeat __________ times. Complete this exercise __________ times a day. Ankle alphabet  Sit with your left / right leg supported at the lower leg. Do not rest your foot on anything. Make sure your foot has room to move freely. Think of your left / right foot as a  paintbrush. Move your foot to trace each letter of the alphabet in the air. Keep your hip and knee still while you trace. Make the letters as large as you can without feeling discomfort. Trace every letter from A to Z. Repeat __________ times. Complete this exercise __________ times a day. Strengthening exercises These exercises build strength and endurance in your ankle and lower leg. Endurance is the ability to use your muscles for a long time, even after they get tired. Ankle dorsiflexion  Secure a rubber exercise band or tube to an object, such as a table leg, that will stay still when the band is pulled. Secure the other end around your left / right foot. Sit on the floor facing the object, with your left / right leg extended. The band or tube should be slightly tense when your foot is relaxed. Slowly bring your foot toward you, bringing the top of your foot toward your shin (dorsiflexion), and pulling the band tighter. Hold this position for __________ seconds. Slowly return your foot to the starting position. Repeat __________ times. Complete this exercise __________ times a day. Ankle plantar flexion  Sit on the floor with your left / right leg extended. Loop a rubber exercise tube or band around the ball of your left / right foot. The ball of your foot is on the walking surface, right under your toes. Hold the ends of the band or tube in your hands. The band or tube should be  slightly tense when your foot is relaxed. Slowly point your foot and toes downward to tilt the top of your foot away from your shin (plantar flexion). Hold this position for __________ seconds. Slowly return your foot to the starting position. Repeat __________ times. Complete this exercise __________ times a day. Ankle eversion  Sit on the floor with your legs straight out in front of you. Loop a rubber exercise band or tube around the ball of your left / right foot. The ball of your foot is on the walking  surface, right under your toes. Hold the ends of the band in your hands or secure the band to a stable object. The band or tube should be slightly tense when your foot is relaxed. Slowly push your foot outward, away from your other leg (eversion). Hold this position for __________ seconds. Slowly return your foot to the starting position. Repeat __________ times. Complete this exercise __________ times a day. This information is not intended to replace advice given to you by your health care provider. Make sure you discuss any questions you have with your health care provider. Document Revised: 07/08/2022 Document Reviewed: 07/08/2022 Elsevier Patient Education  2024 [employer]  Ankle Sprain, Phase II Rehab An ankle sprain is an injury to tissue that connects bone to bone (a ligament) in the ankle. Ankle sprains can cause stiffness, loss of motion, and loss of strength. Ask your health care provider which exercises are safe for you. Do exercises exactly as told by your provider and adjust them as directed. It is normal to feel mild stretching, pulling, tightness, or discomfort as you do these exercises. Stop right away if you feel sudden pain or your pain gets worse. Do not begin these exercises until told by your provider. Stretching and range-of-motion exercises These exercises warm up your muscles and joints. They can improve the movement and flexibility of your lower leg and ankle. They also help to relieve pain and stiffness. Standing gastroc stretch This exercise is also called a standing calf (gastroc) stretch. Stand with your hands against a wall. Extend your left / right leg behind you. Bend your front knee slightly. Your heels should be on the floor. Keeping your heels on the floor and your back knee straight, shift your weight toward the wall. You should feel a gentle stretch in the back of your lower leg (calf). Hold this position for __________ seconds. Repeat __________ times.  Complete this exercise __________ times a day. Standing soleus stretch This exercise is also called a standing calf (soleus) stretch. Stand with your hands against a wall. Extend your left / right leg behind you. Bend your front knee slightly. Both of your heels should be on the floor. Keeping your heels on the floor, bend your back knee and shift your weight slightly over your back leg. You should feel a gentle stretch deep in your calf. Hold this position for __________ seconds. Repeat __________ times. Complete this exercise __________ times a day. Strengthening exercises These exercises build strength and endurance in your lower leg. Endurance is the ability to use your muscles for a long time, even after they get tired. Heel walking  This exercise is sometimes called dorsiflexion. Walk on your heels for __________ seconds or ___________ steps. Keep your toes as high as possible. Repeat __________ times. Complete this exercise __________ times a day. Balance exercises These exercises improve your balance and the reaction and control of your ankle. They help to improve stability. Multi-angle lunge  Stand with your feet together. Take a step forward with your left / right leg. Shift your weight onto that leg. Your back heel will come off the floor and your back toes will stay in place. Push off your front leg to return your front foot to the starting position next to your other foot. Repeat to the side, to the back, and any other directions as told by your provider. Repeat __________ times. Complete this exercise __________ times a day. Single leg stand If this exercise is too easy, you can try it with your eyes closed or while standing on a pillow. Without shoes, stand near a railing or in a doorframe. Hold on to the railing or doorframe as needed. Let loose of the railing or doorframe as you are able. Stand on your left / right foot. Keep your big toe down on the floor and try to keep  your arch lifted. Hold this position for __________ seconds. Repeat __________ times. Complete this exercise __________ times a day. Ankle inversion and eversion This exercise is also called foot rotation with a balance board. It uses a balance board to rotate the foot and ankle inward (inversion) and outward (eversion). Ask your provider where you can get a balance board or how you can make one. Stand on a non-carpeted surface near a countertop or wall. Step onto the balance board so your feet are hip width apart. Keep your feet in place. Keep your upper body and hips steady. Using only your feet and ankles to move the board, do these exercises as told by your provider: Tip the board side to side as far as you can, switching between tipping to the left and tipping to the right. Tip the board so it silently taps the floor. Do not let the board hit the floor with force. From time to time, pause to hold a steady midway position, with neither the right nor the left sides touching the ground. Tip the board side to side so it does not hit the floor at all. From time to time, pause to hold a steady midway position. Repeat __________ times. Complete this exercise __________ times a day. Ankle plantar flexion and dorsiflexion This exercise is also called foot flexion with a balance board. It uses a balance board to push the foot downward and away from the leg (plantar flexion) or upward and toward the leg (dorsiflexion). Ask your provider where you can get a balance board or how you can make one. Stand on a non-carpeted surface near a countertop or wall. Step onto the balance board so your feet are hip width apart. Keep your feet in place. Keep your upper body and hips steady. Using only your feet and ankles to move the board, do one or both of these exercises as told by your provider: Tip the board forward and backward so it silently taps the floor. Do not let the board hit the floor with force. From  time to time, pause to hold a steady position midway between touching the floor in front and touching the floor in back. Tip the board forward and backward so it does not hit the floor at all. From time to time, pause to hold a steady position in the middle. Repeat __________ times. Complete this exercise __________ times a day. This information is not intended to replace advice given to you by your health care provider. Make sure you discuss any questions you have with your health care provider. Document Revised: 07/08/2022  Document Reviewed: 07/08/2022 Elsevier Patient Education  2024 [employer].

## 2024-06-26 NOTE — Telephone Encounter (Signed)
 FYI Only or Action Required?: FYI only for provider.  Patient was last seen in primary care on 05/31/2024 by Alvan Dorothyann BIRCH, MD.  Called Nurse Triage reporting Foot Injury.  Symptoms began several days ago.  Interventions attempted: Rest, hydration, or home remedies.  Symptoms are: gradually worsening.  Triage Disposition: See HCP Within 4 Hours (Or PCP Triage)  Patient/caregiver understands and will follow disposition?: Yes Reason for Disposition  [1] SEVERE pain (e.g., excruciating) AND [2] not improved 2 hours after pain medicine/ice packs  Answer Assessment - Initial Assessment Questions Left foot pain and edema with bruising post trip/fall on Saturday. 6/10 pain.   1. MECHANISM: How did the injury happen? (e.g., twisting injury, direct blow)      Trip/fall wearing heels  2. ONSET: When did the injury happen? (e.g., minutes or hours ago)      06/24/24  3. LOCATION: Where is the injury located?      Left foot  4. APPEARANCE of INJURY: What does the injury look like?      Bruised and swollen  5. WEIGHT-BEARING: Can you put weight on that foot? Can you walk (four steps or more)?       Yes  Protocols used: Foot Injury-A-AH Copied from CRM [id number]. Topic: Clinical - Red Word Triage >> Jun 26, 2024  8:25 AM Antony RAMAN wrote: Red Word that prompted transfer to Nurse Triage: pt fell over weekend wearing heels, left foot is swollen and painful

## 2024-06-26 NOTE — Telephone Encounter (Signed)
 Patient seen today at [facility]

## 2024-09-12 ENCOUNTER — Ambulatory Visit: Admitting: Family Medicine

## 2024-10-05 ENCOUNTER — Other Ambulatory Visit: Payer: Self-pay | Admitting: Family Medicine

## 2024-10-05 DIAGNOSIS — F411 Generalized anxiety disorder: Secondary | ICD-10-CM

## 2024-11-01 ENCOUNTER — Ambulatory Visit
Admission: RE | Admit: 2024-11-01 | Discharge: 2024-11-01 | Disposition: A | Discharge status: Home / Self Care | Source: Ambulatory Visit

## 2024-11-01 VITALS — BP 118/80 | HR 106 | Temp 98.9°F | Resp 18

## 2024-11-01 DIAGNOSIS — J039 Acute tonsillitis, unspecified: Secondary | ICD-10-CM

## 2024-11-01 LAB — POCT RAPID STREP A (OFFICE): Rapid Strep A Screen: NEGATIVE

## 2024-11-01 MED ORDER — ONDANSETRON 4 MG PO TBDP: 4.0000 mg | ORAL_TABLET | Freq: Three times a day (TID) | ORAL | 0 refills | Status: AC | PRN

## 2024-11-01 MED ORDER — AMOXICILLIN-POT CLAVULANATE 875-125 MG PO TABS: 1.0000 | ORAL_TABLET | Freq: Two times a day (BID) | ORAL | 0 refills | Status: AC

## 2024-11-01 MED ORDER — METHYLPREDNISOLONE SODIUM SUCC 125 MG IJ SOLR
80.0000 mg | Freq: Once | INTRAMUSCULAR | Status: AC
  Administered 2024-11-01: 80 mg via INTRAMUSCULAR

## 2024-11-01 MED ORDER — ONDANSETRON 4 MG PO TBDP
4.0000 mg | ORAL_TABLET | Freq: Once | ORAL | Status: AC
  Administered 2024-11-01: 4 mg via ORAL

## 2024-11-01 NOTE — Discharge Instructions (Addendum)
 Your strep test today is negative.  Streptococcal throat culture will be performed per our protocol, please keep in mind that the rapid strep test that we perform here at urgent care only catches 40% of strep throat infections. Based on my physical exam findings and the history you provided to me today, I recommend that you begin antibiotics now for presumed strep throat instead of waiting for the strep culture result.  I have sent a prescription to your pharmacy.  After 24 hours of antibiotics, you should begin to feel significantly better.    After 24 hours, please discard your toothbrush as well as any other oral devices that you are currently using and replace them with new ones to avoid reinfection.  Also after 24 hours, you will no longer be contagious.    Please read below to learn more about the medications, dosages and frequencies that I recommend to help alleviate your symptoms and to get you feeling better soon:   Solu-Medrol  IM (methylprednisolone ):  To quickly address your significant respiratory inflammation, you were provided with an injection of Solu-Medrol  in the office today.  You should continue to feel the full benefit of the steroid for the next 4 to 6 hours.    Augmentin  (amoxicillin  - clavulanic acid):  Please take one (1) tablet twice daily for 10 days.  This antibiotic can cause upset stomach, this will resolve once antibiotics are complete.  You are welcome to take a probiotic, eat yogurt, take Imodium while taking this medication.  Please avoid other systemic medications such as Maalox, Pepto-Bismol or milk of magnesia as they can interfere with the body's ability to absorb the antibiotics. It is very important that you finish all antibiotics as prescribed, even if you are feeling better, to ensure every last bacteria that is causing the infection is completely resolved.    Advil, Motrin (ibuprofen): This is a good anti-inflammatory medication which addresses aches, pains and  inflammation of the upper airways that causes sinus and nasal congestion as well as in the lower airways which makes your cough feel tight and sometimes burn.  I recommend that you take  400 mg every 6-8 hours as needed.  Please do not take more than 2400 mg of ibuprofen in a 24-hour period and please do not take high doses of ibuprofen for more than 3 days in a row as this can lead to stomach ulcers.   Zofran  (ondansetron ): This is a good antinausea medication that you can take every 8 hours as needed for symptoms of nausea and vomiting.  It is a dissolvable tablet that you do not need to take with water, just put it under your tongue and let it melt away.  I have sent a prescription to your pharmacy.   Taking antibiotics can often cause patients to develop a vaginal yeast infection.  While taking the antibiotic prescribed for you today, or in the 7 to 10 days after you finish taking the antibiotic prescribed for you today, if you develop symptoms of vaginal yeast infection including thick, white vaginal discharge and/or vaginal itching please contact us  at the phone number listed on the front of this after visit summary.  We will be happy to provide you with a prescription of Diflucan for empiric treatment of presumed antibiotic induced vaginal yeast infection.  You will not need to be seen first.   If symptoms have not meaningfully improved in the next 7 to 10 days, please return for repeat evaluation or follow-up with  your regular provider.  If symptoms have worsened in the next 3 to 5 days, please go to the emergency room for further evaluation.    Thank you for visiting urgent care today.  We appreciate the opportunity to participate in your care.

## 2024-11-01 NOTE — ED Provider Notes (Addendum)
 [facility]    CSN: [id number] Arrival date & time: 11/01/24  9187    HISTORY   Chief Complaint  Patient presents with   Fever    Entered by patient   Sore Throat   HPI Sandra Cross is a pleasant, 29 y.o. female who presents to urgent care today. Patient states that yesterday she began to have subjective fever, chills, sore throat, tonsil swelling, muffled voice.  Patient states that she frequently gets bacterial tonsillitis.  Patient states she took a dose of leftover amoxicillin  last night and again this morning, states this morning she threw up her dose 15 minutes after she took it.  Patient states she has also taken aspirin which has helped.  Patient states her daughter had RSV last week but denies cough, congestion.  Patient denies other known sick contacts, possible oral exposure to STD, difficulty swallowing, difficulty maintaining airway, difficulty managing secretions.  Patient has an elevated heart rate on arrival with otherwise normal vital signs.  The history is provided by the patient.  Fever Sore Throat   History reviewed. No pertinent past medical history. Patient Active Problem List   Diagnosis Date Noted   Calculus of gallbladder without cholecystitis without obstruction 06/10/2022   RUQ pain 06/04/2022   Low grade squamous intraepithelial lesion on cytologic smear of cervix (LGSIL) 10/29/2021   History of drug use 08/12/2021   BMI 31.0-31.9,adult 08/07/2021   Migraine 08/06/2020   MDD (major depressive disorder), recurrent episode, moderate (HCC) 08/03/2016   GAD (generalized anxiety disorder) 08/03/2016   Past Surgical History:  Procedure Laterality Date   CHOLECYSTECTOMY N/A 07/08/2022   Procedure: LAPAROSCOPIC CHOLECYSTECTOMY WITH INTRAOPERATIVE CHOLANGIOGRAM;  Surgeon: Curvin Deward MOULD, MD;  Location: [facility];  Service: General;  Laterality: N/A;   WISDOM TOOTH EXTRACTION     OB History     Gravida  1   Para  1   Term  1   Preterm       AB      Living  1      SAB      IAB      Ectopic      Multiple      Live Births  1          Home Medications    Prior to Admission medications  Medication Sig Start Date End Date Taking? Authorizing Provider  amoxicillin -clavulanate (AUGMENTIN ) 875-125 MG tablet Take 1 tablet by mouth 2 (two) times daily for 10 days. 11/01/24 11/11/24 Yes Joesph Shaver Scales, PA-C  risperiDONE  (RISPERDAL ) 0.5 MG tablet TAKE 1 TABLET BY MOUTH AT BEDTIME. 04/28/24  Yes Alvan Dorothyann BIRCH, MD  sertraline  (ZOLOFT ) 100 MG tablet TAKE 1 TABLET BY MOUTH EVERY DAY 10/06/24  Yes Alvan Dorothyann BIRCH, MD  topiramate  (TOPAMAX ) 50 MG tablet Take 1 tablet (50 mg total) by mouth 2 (two) times daily. 05/11/24  Yes Alvan Dorothyann BIRCH, MD    Family History Family History  Problem Relation Age of Onset   Healthy Mother    Healthy Father    Healthy Brother    Cancer Paternal Grandmother    Social History Social History[1] Allergies   Aripiprazole  and Venlafaxine   Review of Systems Review of Systems  Constitutional:  Positive for fever.   Pertinent findings revealed after performing a 14 point review of systems has been noted in the history of present illness.  Physical Exam Vital Signs BP 118/80 (BP Location: Right Arm)   Pulse ROLLEN)  106   Temp 98.9 F (37.2 C) (Oral)   Resp 18   LMP 10/23/2024 (Exact Date)   SpO2 98%   No data found.  Physical Exam Vitals and nursing note reviewed.  Constitutional:      General: She is not in acute distress.    Appearance: She is well-developed. She is ill-appearing. She is not toxic-appearing.  HENT:     Head: Normocephalic and atraumatic.     Salivary Glands: Right salivary gland is diffusely enlarged and tender. Left salivary gland is diffusely enlarged and tender.     Right Ear: Hearing and external ear normal.     Left Ear: Hearing and external ear normal.     Ears:     Comments: Bilateral EACs with mild erythema, bilateral TMs are  normal    Nose: No mucosal edema, congestion or rhinorrhea.     Mouth/Throat:     Lips: Pink. No lesions.     Mouth: Mucous membranes are moist. No oral lesions or angioedema.     Dentition: No gingival swelling.     Tongue: No lesions.     Palate: No mass.     Pharynx: Uvula midline. Pharyngeal swelling, oropharyngeal exudate and posterior oropharyngeal erythema present. No uvula swelling.     Tonsils: Tonsillar exudate present. 3+ on the right. 3+ on the left.  Eyes:     General: Lids are normal.     Conjunctiva/sclera: Conjunctivae normal.  Neck:     Thyroid: No thyroid mass, thyromegaly or thyroid tenderness.     Trachea: Trachea normal.     Comments: Voice is muffled Cardiovascular:     Rate and Rhythm: Normal rate and regular rhythm.  Pulmonary:     Effort: Pulmonary effort is normal.     Breath sounds: Normal breath sounds.  Abdominal:     General: Abdomen is flat. Bowel sounds are normal.     Palpations: Abdomen is soft.     Tenderness: Negative signs include Murphy's sign.  Musculoskeletal:        General: Normal range of motion.     Cervical back: Full passive range of motion without pain, normal range of motion and neck supple.  Lymphadenopathy:     Head:     Right side of head: Submandibular and tonsillar adenopathy present.     Left side of head: Submandibular and tonsillar adenopathy present.     Cervical: Cervical adenopathy present.     Right cervical: Superficial cervical adenopathy and deep cervical adenopathy present.     Left cervical: Superficial cervical adenopathy and deep cervical adenopathy present.     Upper Body:     Right upper body: Supraclavicular adenopathy present.     Left upper body: Supraclavicular adenopathy present.  Skin:    General: Skin is warm and dry.     Findings: No rash.  Neurological:     General: No focal deficit present.     Mental Status: She is alert and oriented to person, place, and time. Mental status is at baseline.   Psychiatric:        Attention and Perception: Attention normal.        Mood and Affect: Mood normal.        Speech: Speech normal.        Behavior: Behavior normal.     Visual Acuity Right Eye Distance:   Left Eye Distance:   Bilateral Distance:    Right Eye Near:   Left Eye Near:  Bilateral Near:     UC Couse / Diagnostics / Procedures:     Radiology No results found.  Procedures Procedures (including critical care time) EKG  Pending results:  Labs Reviewed  CULTURE, GROUP A STREP [facility])    Medications Ordered in UC: Medications  methylPREDNISolone  sodium succinate (SOLU-MEDROL ) 125 mg/2 mL injection 80 mg (has no administration in time range)  ondansetron  (ZOFRAN -ODT) disintegrating tablet 4 mg (4 mg Oral Given 11/01/24 0853)    UC Diagnoses / Final Clinical Impressions(s)   I have reviewed the triage vital signs and the nursing notes.  Pertinent labs & imaging results that were available during my care of the patient were reviewed by me and considered in my medical decision making (see chart for details).    Final diagnoses:  Acute tonsillitis, unspecified etiology   Patient advised of negative rapid strep test, will perform throat culture for further evaluation.  Patient advised to begin Augmentin  for broader spectrum coverage of what is likely a significant bacterial but nonstreptococcal tonsillar infection.  Patient strongly advised to complete all antibiotics as prescribed for best results.  Patient provided with injection of methylprednisolone  for significant tonsillar swelling and muffled voice.   Zofran  provided for nausea and vomiting.  Conservative care recommended.  Return precautions advised.  Please see discharge instructions below for details of plan of care as provided to patient. ED Prescriptions     Medication Sig Dispense Auth. Provider   amoxicillin -clavulanate (AUGMENTIN ) 875-125 MG tablet Take 1 tablet by mouth 2 (two) times daily  for 10 days. 20 tablet Joesph Shaver Scales, PA-C   ondansetron  (ZOFRAN -ODT) 4 MG disintegrating tablet Take 1 tablet (4 mg total) by mouth every 8 (eight) hours as needed for up to 8 doses for nausea or vomiting. 8 tablet Joesph Shaver Scales, PA-C      PDMP not reviewed this encounter.    Discharge Instructions      Your strep test today is negative.  Streptococcal throat culture will be performed per our protocol, please keep in mind that the rapid strep test that we perform here at urgent care only catches 40% of strep throat infections. Based on my physical exam findings and the history you provided to me today, I recommend that you begin antibiotics now for presumed strep throat instead of waiting for the strep culture result.  I have sent a prescription to your pharmacy.  After 24 hours of antibiotics, you should begin to feel significantly better.    After 24 hours, please discard your toothbrush as well as any other oral devices that you are currently using and replace them with new ones to avoid reinfection.  Also after 24 hours, you will no longer be contagious.    Please read below to learn more about the medications, dosages and frequencies that I recommend to help alleviate your symptoms and to get you feeling better soon:   Solu-Medrol  IM (methylprednisolone ):  To quickly address your significant respiratory inflammation, you were provided with an injection of Solu-Medrol  in the office today.  You should continue to feel the full benefit of the steroid for the next 4 to 6 hours.    Augmentin  (amoxicillin  - clavulanic acid):  Please take one (1) tablet twice daily for 10 days.  This antibiotic can cause upset stomach, this will resolve once antibiotics are complete.  You are welcome to take a probiotic, eat yogurt, take Imodium while taking this medication.  Please avoid other systemic medications such as Maalox, Pepto-Bismol or  milk of magnesia as they can interfere with the  body's ability to absorb the antibiotics. It is very important that you finish all antibiotics as prescribed, even if you are feeling better, to ensure every last bacteria that is causing the infection is completely resolved.    Advil, Motrin (ibuprofen): This is a good anti-inflammatory medication which addresses aches, pains and inflammation of the upper airways that causes sinus and nasal congestion as well as in the lower airways which makes your cough feel tight and sometimes burn.  I recommend that you take  400 mg every 6-8 hours as needed.  Please do not take more than 2400 mg of ibuprofen in a 24-hour period and please do not take high doses of ibuprofen for more than 3 days in a row as this can lead to stomach ulcers.   Zofran  (ondansetron ): This is a good antinausea medication that you can take every 8 hours as needed for symptoms of nausea and vomiting.  It is a dissolvable tablet that you do not need to take with water, just put it under your tongue and let it melt away.  I have sent a prescription to your pharmacy.   Taking antibiotics can often cause patients to develop a vaginal yeast infection.  While taking the antibiotic prescribed for you today, or in the 7 to 10 days after you finish taking the antibiotic prescribed for you today, if you develop symptoms of vaginal yeast infection including thick, white vaginal discharge and/or vaginal itching please contact us  at the phone number listed on the front of this after visit summary.  We will be happy to provide you with a prescription of Diflucan for empiric treatment of presumed antibiotic induced vaginal yeast infection.  You will not need to be seen first.   If symptoms have not meaningfully improved in the next 7 to 10 days, please return for repeat evaluation or follow-up with your regular provider.  If symptoms have worsened in the next 3 to 5 days, please go to the emergency room for further evaluation.    Thank you for visiting  urgent care today.  We appreciate the opportunity to participate in your care.     Disposition Upon Discharge:  Condition: stable for discharge home  Patient presented with an acute illness with associated systemic symptoms and significant discomfort requiring urgent management. In my opinion, this is a condition that a prudent lay person (someone who possesses an average knowledge of health and medicine) may potentially expect to result in complications if not addressed urgently such as respiratory distress, impairment of bodily function or dysfunction of bodily organs.   Routine symptom specific, illness specific and/or disease specific instructions were discussed with the patient and/or caregiver at length.   As such, the patient has been evaluated and assessed, work-up was performed and treatment was provided in alignment with urgent care protocols and evidence based medicine.  Patient/parent/caregiver has been advised that the patient may require follow up for further testing and treatment if the symptoms continue in spite of treatment, as clinically indicated and appropriate.  Patient/parent/caregiver has been advised to return to the [facility] or PCP if no better; to PCP or the Emergency Department if new signs and symptoms develop, or if the current signs or symptoms continue to change or worsen for further workup, evaluation and treatment as clinically indicated and appropriate  The patient will follow up with their current PCP if and as advised. If the patient does not currently have  a PCP we will assist them in obtaining one.   The patient may need specialty follow up if the symptoms continue, in spite of conservative treatment and management, for further workup, evaluation, consultation and treatment as clinically indicated and appropriate.  Patient/parent/caregiver verbalized understanding and agreement of plan as discussed.  All questions were addressed during visit.  Please see discharge  instructions below for further details of plan.  This office note has been dictated using Teaching laboratory technician.  Unfortunately, this method of dictation can sometimes lead to typographical or grammatical errors.  I apologize for your inconvenience in advance if this occurs.  Please do not hesitate to reach out to me if clarification is needed.      Joesph Shaver Scales, PA-C 11/01/24 9090     [1]  Social History Tobacco Use   Smoking status: Former    Current packs/day: 0.25    Average packs/day: 0.3 packs/day for 11.1 years (2.8 ttl pk-yrs)    Types: Cigarettes    Start date: 09/09/2013    Passive exposure: Current   Smokeless tobacco: Never  Vaping Use   Vaping status: Every Day   Start date: 06/22/2029  Substance Use Topics   Alcohol use: Never   Drug use: No     Joesph Shaver [place], PA-C 11/01/24 9089  "

## 2024-11-01 NOTE — ED Triage Notes (Addendum)
 Yesterday she started having a fever, chills, sore throat, and tonsil swelling. She took a dose of Amoxicillin  this morning and threw up 15 minutes after. She has tried taking Aspirin which has helped.   Her daughter did have RSV last week. Pt denies any respiratory symptoms.

## 2024-11-03 LAB — CULTURE, GROUP A STREP (THRC)
